# Patient Record
Sex: Male | Born: 1995 | Race: White | Hispanic: No | Marital: Single | Smoking: Former smoker
Health system: Southern US, Community
[De-identification: ages and names within clinical notes are randomized; demographics above are authoritative.]

## PROBLEM LIST (undated history)

## (undated) ENCOUNTER — Emergency Department: Admission: EM | Payer: Self-pay | Source: Home / Self Care

## (undated) DIAGNOSIS — F4310 Post-traumatic stress disorder, unspecified: Secondary | ICD-10-CM

## (undated) DIAGNOSIS — S060XAA Concussion with loss of consciousness status unknown, initial encounter: Secondary | ICD-10-CM

## (undated) DIAGNOSIS — F32A Depression, unspecified: Secondary | ICD-10-CM

## (undated) DIAGNOSIS — F329 Major depressive disorder, single episode, unspecified: Secondary | ICD-10-CM

## (undated) DIAGNOSIS — S060X9A Concussion with loss of consciousness of unspecified duration, initial encounter: Secondary | ICD-10-CM

## (undated) DIAGNOSIS — F419 Anxiety disorder, unspecified: Secondary | ICD-10-CM

---

## 2016-06-15 ENCOUNTER — Encounter: Payer: Self-pay | Admitting: Emergency Medicine

## 2016-06-15 ENCOUNTER — Emergency Department: Payer: Self-pay

## 2016-06-15 ENCOUNTER — Emergency Department
Admission: EM | Admit: 2016-06-15 | Discharge: 2016-06-15 | Disposition: A | Discharge status: Home / Self Care | Payer: Self-pay | Attending: Emergency Medicine | Admitting: Emergency Medicine

## 2016-06-15 DIAGNOSIS — F17200 Nicotine dependence, unspecified, uncomplicated: Secondary | ICD-10-CM | POA: Insufficient documentation

## 2016-06-15 DIAGNOSIS — K5900 Constipation, unspecified: Secondary | ICD-10-CM | POA: Insufficient documentation

## 2016-06-15 DIAGNOSIS — R1084 Generalized abdominal pain: Secondary | ICD-10-CM | POA: Insufficient documentation

## 2016-06-15 LAB — COMPREHENSIVE METABOLIC PANEL
ALK PHOS: 87 U/L (ref 38–126)
ALT: 62 U/L (ref 17–63)
AST: 39 U/L (ref 15–41)
Albumin: 4 g/dL (ref 3.5–5.0)
Anion gap: 4 — ABNORMAL LOW (ref 5–15)
BILIRUBIN TOTAL: 1.2 mg/dL (ref 0.3–1.2)
BUN: 12 mg/dL (ref 6–20)
CALCIUM: 9.1 mg/dL (ref 8.9–10.3)
CO2: 28 mmol/L (ref 22–32)
CREATININE: 0.82 mg/dL (ref 0.61–1.24)
Chloride: 107 mmol/L (ref 101–111)
GFR calc non Af Amer: >60 mL/min (ref >60)
GLUCOSE: 105 mg/dL — AB (ref 65–99)
Potassium: 3.8 mmol/L (ref 3.5–5.1)
SODIUM: 139 mmol/L (ref 135–145)
TOTAL PROTEIN: 6.6 g/dL (ref 6.5–8.1)

## 2016-06-15 LAB — CBC
HCT: 42.7 % (ref 40.0–52.0)
Hemoglobin: 14.3 g/dL (ref 13.0–18.0)
MCH: 29.5 pg (ref 26.0–34.0)
MCHC: 33.5 g/dL (ref 32.0–36.0)
MCV: 88.2 fL (ref 80.0–100.0)
PLATELETS: 213 10*3/uL (ref 150–440)
RBC: 4.85 MIL/uL (ref 4.40–5.90)
RDW: 13.6 % (ref 11.5–14.5)
WBC: 4 10*3/uL (ref 3.8–10.6)

## 2016-06-15 LAB — LIPASE, BLOOD: Lipase: 19 U/L (ref 11–51)

## 2016-06-15 MED ORDER — POLYETHYLENE GLYCOL 3350 17 G PO PACK: 17.0000 g | PACK | Freq: Every day | ORAL | 0 refills | Status: DC

## 2016-06-15 NOTE — ED Provider Notes (Signed)
[facility] Emergency Department Provider Note       Time seen: ----------------------------------------- 7:04 AM on 06/15/2016 -----------------------------------------     I have reviewed the triage vital signs and the nursing notes.   HISTORY   Chief Complaint Abdominal Pain    HPI Jack Holmes is a 21 y.o. male who presents to the ED for lower abdominal pain. Patient reports he was walking to work when the pain started. Patient states the pain is in his lower abdomen bilaterally and relieved with laying down. He arrives alert and oriented and in no distress. Patient reports a dull pain the has had intermittently, nothing makes it better or worse. He denies fevers, chills, vomiting or diarrhea.   History reviewed. No pertinent past medical history.  There are no active problems to display for this patient.   History reviewed. No pertinent surgical history.  Allergies Patient has no known allergies.  Social History Social History  Substance Use Topics  . Smoking status: Light Tobacco Smoker  . Smokeless tobacco: Never Used  . Alcohol use Yes    Review of Systems Constitutional: Negative for fever. Cardiovascular: Negative for chest pain. Respiratory: Negative for shortness of breath. Gastrointestinal:Positive for abdominal pain, negative for vomiting and diarrhea Genitourinary: Negative for dysuria. Musculoskeletal: Negative for back pain. Skin: Negative for rash. Neurological: Negative for headaches, focal weakness or numbness.  10-point ROS otherwise negative.  ____________________________________________   PHYSICAL EXAM:  VITAL SIGNS: ED Triage Vitals  Enc Vitals Group     BP 06/15/16 0631 (!) 149/80     Pulse Rate 06/15/16 0631 84     Resp 06/15/16 0631 18     Temp 06/15/16 0631 98.8 F (37.1 C)     Temp Source 06/15/16 0631 Oral     SpO2 06/15/16 0631 98 %     Weight 06/15/16 0633 201 lb (91.2 kg)     Height  06/15/16 0633  (1.854 m)     Head Circumference --      Peak Flow --      Pain Score 06/15/16 0631 8     Pain Loc --      Pain Edu? --      Excl. in GC? --     Constitutional: Alert and oriented. Well appearing and in no distress. Eyes: Conjunctivae are normal. PERRL. Normal extraocular movements. ENT   Head: Normocephalic and atraumatic.   Nose: No congestion/rhinnorhea.   Mouth/Throat: Mucous membranes are moist.   Neck: No stridor. Cardiovascular: Normal rate, regular rhythm. No murmurs, rubs, or gallops. Respiratory: Normal respiratory effort without tachypnea nor retractions. Breath sounds are clear and equal bilaterally. No wheezes/rales/rhonchi. Gastrointestinal: Nonspecific lower abdominal tenderness, no rebound or guarding. Normal bowel sounds. Musculoskeletal: Nontender with normal range of motion in extremities. No lower extremity tenderness nor edema. Neurologic:  Normal speech and language. No gross focal neurologic deficits are appreciated.  Skin:  Skin is warm, dry and intact. No rash noted. Psychiatric: Mood and affect are normal. Speech and behavior are normal.  ____________________________________________  ED COURSE:  Pertinent labs & imaging results that were available during my care of the patient were reviewed by me and considered in my medical decision making (see chart for details). Patient presents for abdominal pain, we will assess with labs and imaging as indicated.   Procedures ____________________________________________   LABS (pertinent positives/negatives)  Labs Reviewed  COMPREHENSIVE METABOLIC PANEL - Abnormal; Notable for the following:       Result Value  Glucose, Bld 105 (*)    Anion gap 4 (*)    All other components within normal limits  LIPASE, BLOOD  CBC  URINALYSIS, COMPLETE (UACMP) WITH MICROSCOPIC    RADIOLOGY  Abdomen 2 view IMPRESSION: Moderately increase colonic stool burden may reflect constipation  in the appropriate clinical setting. There is no evidence of small or large bowel obstruction or perforation. ____________________________________________  FINAL ASSESSMENT AND PLAN  Abdominal pain, Constipation  Plan: Patient's labs and imaging were dictated above. Patient had presented for abdominal pain which appears be constipation related. He'll be discharged with MiraLAX and is stable for outpatient follow-up.   Emily Filbert, MD   Note: This note was generated in part or whole with voice recognition software. Voice recognition is usually quite accurate but there are transcription errors that can and very often do occur. I apologize for any typographical errors that were not detected and corrected.     Emily Filbert, MD 06/15/16 0830

## 2016-06-15 NOTE — ED Triage Notes (Signed)
Pt arrived to the ED via EMS for complaints of lower abdominal pain. Pt reports that he was walking to work when the pain started. Pt states that the pain is in the lower abdomen bilaterally, relieved by laying down. Pt is AOx4 in no apparent distress.

## 2016-07-09 ENCOUNTER — Encounter: Payer: Self-pay | Admitting: Emergency Medicine

## 2016-07-09 ENCOUNTER — Emergency Department: Payer: Self-pay

## 2016-07-09 ENCOUNTER — Emergency Department
Admission: EM | Admit: 2016-07-09 | Discharge: 2016-07-09 | Disposition: A | Discharge status: Home / Self Care | Payer: Self-pay | Attending: Emergency Medicine | Admitting: Emergency Medicine

## 2016-07-09 DIAGNOSIS — S0990XA Unspecified injury of head, initial encounter: Secondary | ICD-10-CM

## 2016-07-09 DIAGNOSIS — Y999 Unspecified external cause status: Secondary | ICD-10-CM | POA: Insufficient documentation

## 2016-07-09 DIAGNOSIS — F17200 Nicotine dependence, unspecified, uncomplicated: Secondary | ICD-10-CM | POA: Insufficient documentation

## 2016-07-09 DIAGNOSIS — S060X0A Concussion without loss of consciousness, initial encounter: Secondary | ICD-10-CM | POA: Insufficient documentation

## 2016-07-09 DIAGNOSIS — Y929 Unspecified place or not applicable: Secondary | ICD-10-CM | POA: Insufficient documentation

## 2016-07-09 DIAGNOSIS — W2209XA Striking against other stationary object, initial encounter: Secondary | ICD-10-CM | POA: Insufficient documentation

## 2016-07-09 DIAGNOSIS — M542 Cervicalgia: Secondary | ICD-10-CM

## 2016-07-09 DIAGNOSIS — Z79899 Other long term (current) drug therapy: Secondary | ICD-10-CM | POA: Insufficient documentation

## 2016-07-09 DIAGNOSIS — Y9389 Activity, other specified: Secondary | ICD-10-CM | POA: Insufficient documentation

## 2016-07-09 MED ORDER — TRAMADOL HCL 50 MG PO TABS
50.0000 mg | ORAL_TABLET | Freq: Four times a day (QID) | ORAL | 0 refills | Status: DC | PRN
Start: 2016-07-09 — End: 2017-06-08

## 2016-07-09 MED ORDER — IBUPROFEN 600 MG PO TABS
600.0000 mg | ORAL_TABLET | Freq: Once | ORAL | Status: AC
  Administered 2016-07-09: 600 mg via ORAL
  Filled 2016-07-09: qty 1

## 2016-07-09 NOTE — Discharge Instructions (Signed)
Please follow up with the acute care clinic and continue to rest. Please drink lots of water and return if you have any worsening conditions or symptoms.

## 2016-07-09 NOTE — ED Notes (Signed)
Spoke with Dr. Zenda AlpersWebster regarding patient, no orders at this time.

## 2016-07-09 NOTE — ED Provider Notes (Signed)
[facility] Emergency Department Provider Note   ____________________________________________   First MD Initiated Contact with Patient 07/09/16 [phone]     (approximate)  I have reviewed the triage vital signs and the nursing notes.   HISTORY  Chief Complaint Head Injury    HPI Jack Holmes is a 21 y.o. male who comes into the hospital today with a head injury. The patient reports he bumped his head on a heavy wooden door. He reports that he was walking and tripped and hit his head on the door. He states that he hit his head hard. It is on the right frontotemporal area. The patient denies loss of consciousness but reports that he felt lightheaded and dizzy and thought he was going to pass out. The patient has had some nausea with no vomiting and he is still dizzy. He didn't take anything for pain at home. After waiting he determined the pain was too intense and he wanted to make sure that everything was okay. The patient rates his pain a 7 out of 10 in intensity. He also endorses some mild neck pain. The patient has no blurred vision. He did ice his head in the waiting room. He is here today for evaluation.   History reviewed. No pertinent past medical history.  There are no active problems to display for this patient.   History reviewed. No pertinent surgical history.  Prior to Admission medications   Medication Sig Start Date End Date Taking? Authorizing Provider  polyethylene glycol (MIRALAX / GLYCOLAX) packet Take 17 g by mouth daily. 06/15/16   Emily Filbert, MD  traMADol (ULTRAM) 50 MG tablet Take 1 tablet (50 mg total) by mouth every 6 (six) hours as needed. 07/09/16   Rebecka Apley, MD    Allergies Patient has no known allergies.  No family history on file.  Social History Social History  Substance Use Topics  . Smoking status: Light Tobacco Smoker  . Smokeless tobacco: Never Used  . Alcohol use Yes    Review of  Systems  Constitutional: No fever/chills Eyes: No visual changes. ENT: No sore throat. Cardiovascular: Denies chest pain. Respiratory: Denies shortness of breath. Gastrointestinal: Nausea with No abdominal pain, no vomiting.  No diarrhea.  No constipation. Genitourinary: Negative for dysuria. Musculoskeletal: Negative for back pain. Skin: Negative for rash. Neurological: Dizzy and lightheaded, headache   ____________________________________________   PHYSICAL EXAM:  VITAL SIGNS: ED Triage Vitals   Enc Vitals Group     BP 138/86     Pulse Rate 79     Resp (!) 22     Temp 97.9 F (36.6 C)     Temp Source Oral     SpO2 98 %     Weight      Height      Head Circumference      Peak Flow      Pain Score 8     Pain Loc      Pain Edu?      Excl. in GC?     Constitutional: Alert and oriented. Well appearing and in moderate distress. Eyes: Conjunctivae are normal. PERRL. EOMI. Head: Abrasion to right temporal frontal area with no active bleeding.. Nose: No congestion/rhinnorhea. Mouth/Throat: Mucous membranes are moist.  Oropharynx non-erythematous. Cardiovascular: Normal rate, regular rhythm. Grossly normal heart sounds.  Good peripheral circulation. Respiratory: Normal respiratory effort.  No retractions. Lungs CTAB. Gastrointestinal: Soft and nontender. No distention.  Positive bowel sounds Musculoskeletal: No lower extremity tenderness  nor edema.   Neurologic:  Normal speech and language. Cranial nerves II through XII are grossly intact with no focal motor or neuro deficits Skin:  Skin is warm, dry and intact.  Psychiatric: Mood and affect are normal.   ____________________________________________   LABS (all labs ordered are listed, but only abnormal results are displayed)  Labs Reviewed - No data to display ____________________________________________  EKG  none ____________________________________________  RADIOLOGY  CT head and cervical  spine ____________________________________________   PROCEDURES  Procedure(s) performed: None  Procedures  Critical Care performed: No  ____________________________________________   INITIAL IMPRESSION / ASSESSMENT AND PLAN / ED COURSE  Pertinent labs & imaging results that were available during my care of the patient were reviewed by me and considered in my medical decision making (see chart for details).  This is a 21 year old male who comes into the hospital today with some headache after hitting his head on a door. I did perform a CT scan of his head and cervical spine for evaluation. I will reassess the patient. He will receive a dose of ibuprofen for his headache.  Clinical Course as of Jul 10 507  Wynelle LinkSun Jul 09, 2016  0506 Negative CT HEAD.  Negative motion degraded CT CERVICAL SPINE.   CT Head Wo Contrast [AW]    Clinical Course User Index [AW] Rebecka ApleyWebster, Allison P, MD    The patient's CT scan is unremarkable he'll be discharged home to follow-up. ____________________________________________   FINAL CLINICAL IMPRESSION(S) / ED DIAGNOSES  Final diagnoses:  Neck pain  Injury of head, initial encounter  Concussion without loss of consciousness, initial encounter      NEW MEDICATIONS STARTED DURING THIS VISIT:  New Prescriptions   TRAMADOL (ULTRAM) 50 MG TABLET    Take 1 tablet (50 mg total) by mouth every 6 (six) hours as needed.     Note:  This document was prepared using Dragon voice recognition software and may include unintentional dictation errors.    Rebecka ApleyWebster, Allison P, MD 07/09/16 [phone]

## 2016-07-09 NOTE — ED Triage Notes (Signed)
Patient reports he hit his head on a door, denies loss of consciousness.  Abrasion note to right scalp.

## 2016-07-11 ENCOUNTER — Encounter: Payer: Self-pay | Admitting: Physician Assistant

## 2016-07-11 ENCOUNTER — Emergency Department
Admission: EM | Admit: 2016-07-11 | Discharge: 2016-07-11 | Disposition: A | Discharge status: Home / Self Care | Attending: Emergency Medicine | Admitting: Emergency Medicine

## 2016-07-11 DIAGNOSIS — S0081XA Abrasion of other part of head, initial encounter: Secondary | ICD-10-CM | POA: Insufficient documentation

## 2016-07-11 DIAGNOSIS — F17200 Nicotine dependence, unspecified, uncomplicated: Secondary | ICD-10-CM | POA: Insufficient documentation

## 2016-07-11 DIAGNOSIS — Y939 Activity, unspecified: Secondary | ICD-10-CM | POA: Insufficient documentation

## 2016-07-11 DIAGNOSIS — Y929 Unspecified place or not applicable: Secondary | ICD-10-CM | POA: Insufficient documentation

## 2016-07-11 DIAGNOSIS — Y999 Unspecified external cause status: Secondary | ICD-10-CM | POA: Insufficient documentation

## 2016-07-11 DIAGNOSIS — S0990XA Unspecified injury of head, initial encounter: Secondary | ICD-10-CM | POA: Diagnosis present

## 2016-07-11 DIAGNOSIS — W01198A Fall on same level from slipping, tripping and stumbling with subsequent striking against other object, initial encounter: Secondary | ICD-10-CM | POA: Diagnosis not present

## 2016-07-11 DIAGNOSIS — S069X9A Unspecified intracranial injury with loss of consciousness of unspecified duration, initial encounter: Secondary | ICD-10-CM

## 2016-07-11 MED ORDER — METOCLOPRAMIDE HCL 5 MG PO TABS: 5.0000 mg | ORAL_TABLET | Freq: Three times a day (TID) | ORAL | 0 refills | Status: DC | PRN

## 2016-07-11 MED ORDER — METOCLOPRAMIDE HCL 10 MG PO TABS
10.0000 mg | ORAL_TABLET | Freq: Once | ORAL | Status: AC
  Administered 2016-07-11: 10 mg via ORAL
  Filled 2016-07-11: qty 1

## 2016-07-11 NOTE — ED Provider Notes (Signed)
[facility] Emergency Department Provider Note ____________________________________________  Time seen: 1556  I have reviewed the triage vital signs and the nursing notes.  HISTORY  Chief Complaint  Head Injury  HPI Jack Holmes is a 21 y.o. male returns to the ED for evaluation of another minor head injury with self-reported LOC. He was initially seen here about 3 days prior for a minor head contusion after he tripped while walking through a doorway, and hit his right-sided temporo-forehead on the door. He apparently sustained a small superficial abrasion but was evaluated heredue to dizziness and lightheadedness. He denies any LOC, vomiting, or weakness. He was actually evaluated by CT scan and found to have normal head & c-spine scans. He was discharged with a prescription for Ultram. He did not fill the prescription and has been dosing Aleve in the interim. He returned to work a day later. He presents today, accompanied by his fiancee, for evaluation of continued concussive symptoms after he tripped on a wet floor last night. He reports walking through the kitchen barefoot, and slipping and hitting the right temporo-forehead on the broad side of the refrigerator. He reports he was unconscious for about 5 minutes, because he looked at the clock after the fall. He states he knows he passed out, "because I have passed out before, and I know what it feels like." He then walked to the couch for his cell phone to call his fiancee. He presents now, at the advice of his girlfriend, because she is concerned for serious injury, since he hit the same side of his head. He has had complaints of "pressure" to the right side of the head, confusion, nausea, an dizziness. He denies any vision change, vomiting, or distal  paresthesias.   History reviewed. No pertinent past medical history.  There are no active problems to display for this patient.  History reviewed. No pertinent  surgical history.  Prior to Admission medications   Medication Sig Start Date End Date Taking? Authorizing Provider  metoCLOPramide (REGLAN) 5 MG tablet Take 1 tablet (5 mg total) by mouth every 8 (eight) hours as needed for nausea or vomiting. 07/11/16   Delainee Tramel, Charlesetta Ivory, PA-C  polyethylene glycol (MIRALAX / GLYCOLAX) packet Take 17 g by mouth daily. 06/15/16   Emily Filbert, MD  traMADol (ULTRAM) 50 MG tablet Take 1 tablet (50 mg total) by mouth every 6 (six) hours as needed. 07/09/16   Rebecka Apley, MD    Allergies Patient has no known allergies.  No family history on file.  Social History Social History  Substance Use Topics  . Smoking status: Light Tobacco Smoker  . Smokeless tobacco: Never Used  . Alcohol use Yes    Review of Systems  Constitutional: Negative for fever. Eyes: Negative for visual changes. ENT: Negative for sore throat. Cardiovascular: Negative for chest pain. Respiratory: Negative for shortness of breath. Gastrointestinal: Negative for abdominal pain, vomiting and diarrhea. Report nausea Skin: Negative for rash. Neurological: Negative for focal weakness or numbness. Reports confusion and dizziness.  ____________________________________________  PHYSICAL EXAM:  VITAL SIGNS: ED Triage Vitals  Enc Vitals Group     BP 07/11/16 1527 (!) 154/82     Pulse Rate 07/11/16 1527 83     Resp 07/11/16 1527 16     Temp 07/11/16 1527 98.4 F (36.9 C)     Temp Source 07/11/16 1527 Oral     SpO2 07/11/16 1527 98 %     Weight 07/11/16 1528 201  lb (91.2 kg)     Height 07/11/16 1528 6\' 1"  (1.854 m)     Head Circumference --      Peak Flow --      Pain Score 07/11/16 1527 3     Pain Loc --      Pain Edu? --      Excl. in GC? --     Constitutional: Alert and oriented. Well appearing and in no distress. Head: Normocephalic and atraumatic. No edema, abrasion, erythema, hematoma, or laceration. Small, scabbed 0.3 cm abrasion from previous injury.   Eyes: Conjunctivae are normal. PERRL. Normal extraocular movements and fundi bilaterally.  Ears: Canals clear. TMs intact bilaterally. Nose: No congestion/rhinorrhea/epistaxis. Mouth/Throat: Mucous membranes are moist. Neck: Supple. No thyromegaly. Hematological/Lymphatic/Immunological: No cervical lymphadenopathy. Cardiovascular: Normal rate, regular rhythm. Normal distal pulses. Respiratory: Normal respiratory effort. No wheezes/rales/rhonchi. Gastrointestinal: Soft and nontender. No distention. Musculoskeletal: Nontender with normal range of motion in all extremities.  Neurologic: CN II-XII grossly intact. Normal finger-to-nose. Negative Romberg. No pronator drift. Normal rapid-alternating movements.  Normal gait without ataxia. Normal tandem walk. Normal UE/LE DTRs bilaterally. Normal speech and language. No gross focal neurologic deficits are appreciated. Skin:  Skin is warm, dry and intact. No rash noted. Psychiatric: Mood and affect are normal. Patient exhibits appropriate insight and judgment. ____________________________________________  INITIAL IMPRESSION / ASSESSMENT AND PLAN / ED COURSE  Patient with a minor head contusion with a self-reported LOC, after a ground-level fall. His exam is benign at this time. He is reassured by his exam findings which are not suggestive a serious brain injury. Give his previous minor head injury following another low-impact mechanism, he likely has an exacerbation of his concussion symptoms. He has a low likelihood of serious, organic brain injury on this presentation. He will be discharged with a prescription for Reglan and head injury precautions. He will return to work in 2-days as scheduled. [facility] or return as needed.  ____________________________________________  FINAL CLINICAL IMPRESSION(S) / ED DIAGNOSES  Final diagnoses:  Minor head injury, initial encounter  Minor head injury with loss of consciousness, initial encounter  [facility](HCC)      Karmen StabsMenshew, Charlesetta IvoryJenise V Bacon, PA-C 07/11/16 1806    Sharman CheekStafford, Phillip, MD 07/15/16 1507

## 2016-07-11 NOTE — ED Triage Notes (Signed)
Pt reports he was seen in ED Saturday and dx with a concussion. Pt went home and reports last night he slipped and hit head on the refrigerator and was unconscious for 5 minutes. Pt reports having confusion, nausea, and dizziness since fall. No blurred vision. No changes in speech or neuro deficits noted.

## 2016-07-11 NOTE — ED Notes (Signed)
See triage note, pt alert and oriented at this time. Family with pt.  Pt reports hitting head again last night,  Appears in no acute distress.

## 2016-07-11 NOTE — Discharge Instructions (Signed)
Your exam and symptoms are consistent with a minor head injury related to a ground-level fall. You have exacerbated your previous concussive symptoms. Your exam is essentially normal, without any indication of neurological deficits or serious brain injury. Continue to monitor symptoms and take the prescription nausea medicine along with OTC Tylenol and/or Motrin. [facility] or return to the ED for any worsening symptoms as discussed.

## 2016-10-16 ENCOUNTER — Emergency Department

## 2016-10-16 ENCOUNTER — Emergency Department
Admission: EM | Admit: 2016-10-16 | Discharge: 2016-10-16 | Disposition: A | Discharge status: Home / Self Care | Attending: Emergency Medicine | Admitting: Emergency Medicine

## 2016-10-16 DIAGNOSIS — F17200 Nicotine dependence, unspecified, uncomplicated: Secondary | ICD-10-CM | POA: Diagnosis not present

## 2016-10-16 DIAGNOSIS — R079 Chest pain, unspecified: Secondary | ICD-10-CM | POA: Insufficient documentation

## 2016-10-16 DIAGNOSIS — Z79899 Other long term (current) drug therapy: Secondary | ICD-10-CM | POA: Insufficient documentation

## 2016-10-16 DIAGNOSIS — R0789 Other chest pain: Secondary | ICD-10-CM

## 2016-10-16 LAB — CBC WITH DIFFERENTIAL/PLATELET
Basophils Absolute: 0 10*3/uL (ref 0–0.1)
Basophils Relative: 0 %
Eosinophils Absolute: 0.1 10*3/uL (ref 0–0.7)
Eosinophils Relative: 1 %
HEMATOCRIT: 42.6 % (ref 40.0–52.0)
HEMOGLOBIN: 15 g/dL (ref 13.0–18.0)
LYMPHS ABS: 1.9 10*3/uL (ref 1.0–3.6)
LYMPHS PCT: 33 %
MCH: 30.3 pg (ref 26.0–34.0)
MCHC: 35.1 g/dL (ref 32.0–36.0)
MCV: 86.2 fL (ref 80.0–100.0)
Monocytes Absolute: 0.5 10*3/uL (ref 0.2–1.0)
Monocytes Relative: 9 %
NEUTROS ABS: 3.2 10*3/uL (ref 1.4–6.5)
NEUTROS PCT: 57 %
Platelets: 197 10*3/uL (ref 150–440)
RBC: 4.94 MIL/uL (ref 4.40–5.90)
RDW: 12.8 % (ref 11.5–14.5)
WBC: 5.7 10*3/uL (ref 3.8–10.6)

## 2016-10-16 LAB — TROPONIN I: Troponin I: <0.03 ng/mL (ref <0.03)

## 2016-10-16 LAB — BASIC METABOLIC PANEL
ANION GAP: 7 (ref 5–15)
BUN: 11 mg/dL (ref 6–20)
CALCIUM: 9.2 mg/dL (ref 8.9–10.3)
CO2: 24 mmol/L (ref 22–32)
Chloride: 110 mmol/L (ref 101–111)
Creatinine, Ser: 0.93 mg/dL (ref 0.61–1.24)
GFR calc Af Amer: >60 mL/min (ref >60)
GLUCOSE: 101 mg/dL — AB (ref 65–99)
POTASSIUM: 3.3 mmol/L — AB (ref 3.5–5.1)
SODIUM: 141 mmol/L (ref 135–145)

## 2016-10-16 LAB — URINE DRUG SCREEN, QUALITATIVE (ARMC ONLY)
AMPHETAMINES, UR SCREEN: NOT DETECTED
BARBITURATES, UR SCREEN: NOT DETECTED
BENZODIAZEPINE, UR SCRN: NOT DETECTED
Cannabinoid 50 Ng, Ur ~~LOC~~: NOT DETECTED
Cocaine Metabolite,Ur ~~LOC~~: NOT DETECTED
MDMA (Ecstasy)Ur Screen: NOT DETECTED
METHADONE SCREEN, URINE: NOT DETECTED
OPIATE, UR SCREEN: POSITIVE — AB
Phencyclidine (PCP) Ur S: NOT DETECTED
Tricyclic, Ur Screen: NOT DETECTED

## 2016-10-16 LAB — URINALYSIS, COMPLETE (UACMP) WITH MICROSCOPIC
BACTERIA UA: NONE SEEN
BILIRUBIN URINE: NEGATIVE
Glucose, UA: NEGATIVE mg/dL
Hgb urine dipstick: NEGATIVE
Ketones, ur: NEGATIVE mg/dL
LEUKOCYTES UA: NEGATIVE
Nitrite: NEGATIVE
PROTEIN: NEGATIVE mg/dL
SQUAMOUS EPITHELIAL / LPF: NONE SEEN
Specific Gravity, Urine: 1.01 (ref 1.005–1.030)
pH: 6 (ref 5.0–8.0)

## 2016-10-16 LAB — PROTIME-INR
INR: 1.04
Prothrombin Time: 13.6 seconds (ref 11.4–15.2)

## 2016-10-16 LAB — HEPATIC FUNCTION PANEL
ALBUMIN: 3.7 g/dL (ref 3.5–5.0)
ALT: 19 U/L (ref 17–63)
AST: 23 U/L (ref 15–41)
Alkaline Phosphatase: 85 U/L (ref 38–126)
BILIRUBIN DIRECT: 0.1 mg/dL (ref 0.1–0.5)
BILIRUBIN TOTAL: 0.7 mg/dL (ref 0.3–1.2)
Indirect Bilirubin: 0.6 mg/dL (ref 0.3–0.9)
Total Protein: 6.2 g/dL — ABNORMAL LOW (ref 6.5–8.1)

## 2016-10-16 LAB — ETHANOL

## 2016-10-16 MED ORDER — LORAZEPAM 2 MG/ML IJ SOLN
2.0000 mg | Freq: Once | INTRAMUSCULAR | Status: AC
  Administered 2016-10-16: 2 mg via INTRAVENOUS
  Filled 2016-10-16: qty 1

## 2016-10-16 MED ORDER — KETOROLAC TROMETHAMINE 30 MG/ML IJ SOLN
15.0000 mg | Freq: Once | INTRAMUSCULAR | Status: AC
  Administered 2016-10-16: 15 mg via INTRAVENOUS
  Filled 2016-10-16: qty 1

## 2016-10-16 MED ORDER — SODIUM CHLORIDE 0.9 % IV BOLUS (SEPSIS)
1000.0000 mL | Freq: Once | INTRAVENOUS | Status: AC
  Administered 2016-10-16: 1000 mL via INTRAVENOUS

## 2016-10-16 MED ORDER — ONDANSETRON HCL 4 MG/2ML IJ SOLN
4.0000 mg | Freq: Once | INTRAMUSCULAR | Status: AC
  Administered 2016-10-16: 4 mg via INTRAVENOUS
  Filled 2016-10-16: qty 2

## 2016-10-16 MED ORDER — MORPHINE SULFATE (PF) 4 MG/ML IV SOLN
4.0000 mg | Freq: Once | INTRAVENOUS | Status: AC
  Administered 2016-10-16: 4 mg via INTRAVENOUS
  Filled 2016-10-16: qty 1

## 2016-10-16 NOTE — ED Triage Notes (Signed)
Per EMS pt has had a cough for a few days.  Pt states coughing up blood this morning.  Pt was experiencing extreme chest pain at work and called EMS.  Pt is A&Ox4 with a non productive cough.

## 2016-10-16 NOTE — Discharge Instructions (Signed)
Fortunately today your bloodwork your EKG and your chest x-ray were all reassuring. Please establish care with a primary care physician within the next week for reevaluation and return to the emergency department sooner for any concerns whatsoever.  It was a pleasure to take care of you today, and thank you for coming to our emergency department.  If you have any questions or concerns before leaving please ask the nurse to grab me and I'm more than happy to go through your aftercare instructions again.  If you were prescribed any opioid pain medication today such as Norco, Vicodin, Percocet, morphine, hydrocodone, or oxycodone please make sure you do not drive when you are taking this medication as it can alter your ability to drive safely.  If you have any concerns once you are home that you are not improving or are in fact getting worse before you can make it to your follow-up appointment, please do not hesitate to call 911 and come back for further evaluation.  Jack BrittleNeil Teagan Heidrick, MD  Results for orders placed or performed during the hospital encounter of 10/16/16  CBC with Differential/Platelet  Result Value Ref Range   WBC 5.7 3.8 - 10.6 K/uL   RBC 4.94 4.40 - 5.90 MIL/uL   Hemoglobin 15.0 13.0 - 18.0 g/dL   HCT 40.942.6 81.140.0 - 91.452.0 %   MCV 86.2 80.0 - 100.0 fL   MCH 30.3 26.0 - 34.0 pg   MCHC 35.1 32.0 - 36.0 g/dL   RDW 78.212.8 95.611.5 - 21.314.5 %   Platelets 197 150 - 440 K/uL   Neutrophils Relative % 57 %   Neutro Abs 3.2 1.4 - 6.5 K/uL   Lymphocytes Relative 33 %   Lymphs Abs 1.9 1.0 - 3.6 K/uL   Monocytes Relative 9 %   Monocytes Absolute 0.5 0.2 - 1.0 K/uL   Eosinophils Relative 1 %   Eosinophils Absolute 0.1 0 - 0.7 K/uL   Basophils Relative 0 %   Basophils Absolute 0.0 0 - 0.1 K/uL  Protime-INR  Result Value Ref Range   Prothrombin Time 13.6 11.4 - 15.2 seconds   INR 1.04   Troponin I  Result Value Ref Range   Troponin I <0.03 <0.03 ng/mL  Urinalysis, Complete w Microscopic    Result Value Ref Range   Color, Urine YELLOW (A) YELLOW   APPearance CLEAR (A) CLEAR   Specific Gravity, Urine 1.010 1.005 - 1.030   pH 6.0 5.0 - 8.0   Glucose, UA NEGATIVE NEGATIVE mg/dL   Hgb urine dipstick NEGATIVE NEGATIVE   Bilirubin Urine NEGATIVE NEGATIVE   Ketones, ur NEGATIVE NEGATIVE mg/dL   Protein, ur NEGATIVE NEGATIVE mg/dL   Nitrite NEGATIVE NEGATIVE   Leukocytes, UA NEGATIVE NEGATIVE   RBC / HPF 0-5 0 - 5 RBC/hpf   WBC, UA 0-5 0 - 5 WBC/hpf   Bacteria, UA NONE SEEN NONE SEEN   Squamous Epithelial / LPF NONE SEEN NONE SEEN   Mucous PRESENT   Urine Drug Screen, Qualitative  Result Value Ref Range   Tricyclic, Ur Screen NONE DETECTED NONE DETECTED   Amphetamines, Ur Screen NONE DETECTED NONE DETECTED   MDMA (Ecstasy)Ur Screen NONE DETECTED NONE DETECTED   Cocaine Metabolite,Ur [place] NONE DETECTED NONE DETECTED   Opiate, Ur Screen POSITIVE (A) NONE DETECTED   Phencyclidine (PCP) Ur S NONE DETECTED NONE DETECTED   Cannabinoid 50 Ng, Ur [place] NONE DETECTED NONE DETECTED   Barbiturates, Ur Screen NONE DETECTED NONE DETECTED   Benzodiazepine, Ur Scrn NONE  DETECTED NONE DETECTED   Methadone Scn, Ur NONE DETECTED NONE DETECTED  Basic metabolic panel  Result Value Ref Range   Sodium 141 135 - 145 mmol/L   Potassium 3.3 (L) 3.5 - 5.1 mmol/L   Chloride 110 101 - 111 mmol/L   CO2 24 22 - 32 mmol/L   Glucose, Bld 101 (H) 65 - 99 mg/dL   BUN 11 6 - 20 mg/dL   Creatinine, Ser 1.61 0.61 - 1.24 mg/dL   Calcium 9.2 8.9 - 09.6 mg/dL   GFR calc non Af Amer >60 >60 mL/min   GFR calc Af Amer >60 >60 mL/min   Anion gap 7 5 - 15  Ethanol  Result Value Ref Range   Alcohol, Ethyl (B) <5 <5 mg/dL  Hepatic function panel  Result Value Ref Range   Total Protein 6.2 (L) 6.5 - 8.1 g/dL   Albumin 3.7 3.5 - 5.0 g/dL   AST 23 15 - 41 U/L   ALT 19 17 - 63 U/L   Alkaline Phosphatase 85 38 - 126 U/L   Total Bilirubin 0.7 0.3 - 1.2 mg/dL   Bilirubin, Direct 0.1 0.1 - 0.5 mg/dL   Indirect  Bilirubin 0.6 0.3 - 0.9 mg/dL   Dg Chest Port 1 View  Result Date: 10/16/2016 CLINICAL DATA:  Cough, hemoptysis and chest pain. EXAM: PORTABLE CHEST 1 VIEW COMPARISON:  None. FINDINGS: The heart size and mediastinal contours are within normal limits. There is no evidence of pulmonary edema, consolidation, pneumothorax, nodule or pleural fluid. The visualized skeletal structures are unremarkable. IMPRESSION: No active disease. Electronically Signed   By: Irish Lack M.D.   On: 10/16/2016 20:28

## 2016-10-16 NOTE — ED Provider Notes (Signed)
[facility] Emergency Department Provider Note  ____________________________________________   First MD Initiated Contact with Patient 10/16/16 2007     (approximate)  I have reviewed the triage vital signs and the nursing notes.   HISTORY  Chief Complaint Chest Pain    HPI Jack Holmes is a 21 y.o. male history of presents to the emergency Department with roughly 3 days of progressive sharp severe pleuritic chest pain and shortness of breath. Associated with an overwhelming sense of doom. He denies cocaine or methamphetamine use. He denies history of familial hyperlipidemia. He denies smoking. He denies history of deep pain thrombus or pulmonary embolism. He denies antecedent viral illness. Be improved when he calms down and seems to be worse when hyperventilating. It is not positional.   History reviewed. No pertinent past medical history.  There are no active problems to display for this patient.   History reviewed. No pertinent surgical history.  Prior to Admission medications   Medication Sig Start Date End Date Taking? Authorizing Provider  metoCLOPramide (REGLAN) 5 MG tablet Take 1 tablet (5 mg total) by mouth every 8 (eight) hours as needed for nausea or vomiting. 07/11/16   Menshew, Charlesetta IvoryJenise V Bacon, PA-C  polyethylene glycol (MIRALAX / GLYCOLAX) packet Take 17 g by mouth daily. 06/15/16   Emily FilbertWilliams, Jonathan E, MD  traMADol (ULTRAM) 50 MG tablet Take 1 tablet (50 mg total) by mouth every 6 (six) hours as needed. 07/09/16   Rebecka ApleyWebster, Allison P, MD    Allergies Patient has no known allergies.  History reviewed. No pertinent family history.  Social History Social History  Substance Use Topics  . Smoking status: Light Tobacco Smoker  . Smokeless tobacco: Never Used  . Alcohol use Yes    Review of Systems Constitutional: No fever/chills Eyes: No visual changes. ENT: No sore throat. Cardiovascular: Positive chest pain. Respiratory: Positive  shortness of breath. Gastrointestinal: No abdominal pain.  No nausea, no vomiting.  No diarrhea.  No constipation. Genitourinary: Negative for dysuria. Musculoskeletal: Negative for back pain. Skin: Negative for rash. Neurological: Negative for headaches, focal weakness or numbness.   ____________________________________________   PHYSICAL EXAM:  VITAL SIGNS: ED Triage Vitals  Enc Vitals Group     BP      Pulse      Resp      Temp      Temp src      SpO2      Weight      Height      Head Circumference      Peak Flow      Pain Score      Pain Loc      Pain Edu?      Excl. in GC?     Constitutional: Anxious appearing hyperventilating and moaning in pain Eyes: PERRL EOMI. Head: Atraumatic. Nose: No congestion/rhinnorhea. Mouth/Throat: No trismus Neck: No stridor.   Cardiovascular: Normal rate, regular rhythm. Grossly normal heart sounds.  Good peripheral circulation. Respiratory: Hyperventilating taking shallow rapid breaths Gastrointestinal: Soft nontender Musculoskeletal: No lower extremity edema   Neurologic:  Normal speech and language. No gross focal neurologic deficits are appreciated. Skin:  Skin is warm, dry and intact. No rash noted. Psychiatric: Mood and affect are normal. Speech and behavior are normal.    ____________________________________________   DIFFERENTIAL includes but not limited to  Pneumothorax, pulmonary embolus, acute coronary syndrome, pneumonia, panic attack ____________________________________________   LABS (all labs ordered are listed, but only abnormal results are displayed)  Labs Reviewed  URINALYSIS, COMPLETE (UACMP) WITH MICROSCOPIC - Abnormal; Notable for the following:       Result Value   Color, Urine YELLOW (*)    APPearance CLEAR (*)    All other components within normal limits  URINE DRUG SCREEN, QUALITATIVE ([facility] ONLY) - Abnormal; Notable for the following:    Opiate, Ur Screen POSITIVE (*)    All other components  within normal limits  BASIC METABOLIC PANEL - Abnormal; Notable for the following:    Potassium 3.3 (*)    Glucose, Bld 101 (*)    All other components within normal limits  HEPATIC FUNCTION PANEL - Abnormal; Notable for the following:    Total Protein 6.2 (*)    All other components within normal limits  CBC WITH DIFFERENTIAL/PLATELET  PROTIME-INR  TROPONIN I  ETHANOL  BRAIN NATRIURETIC PEPTIDE    No signs of acute ischemia __________________________________________  EKG  ED ECG REPORT I, Merrily Brittle, the attending physician, personally viewed and interpreted this ECG.  Date: 10/16/2016 Rate: 91 Rhythm: normal sinus rhythm QRS Axis: normal Intervals: normal ST/T Wave abnormalities: Mild concave up lateral trace ST elevation with notching in V6 Narrative Interpretation: unremarkable most likely benign early repolarization and not pericarditis  ____________________________________________  RADIOLOGY  Chest x-ray with no acute disease ____________________________________________   PROCEDURES  Procedure(s) performed: no  Procedures  Critical Care performed: no  Observation: no ____________________________________________   INITIAL IMPRESSION / ASSESSMENT AND PLAN / ED COURSE  Pertinent labs & imaging results that were available during my care of the patient were reviewed by me and considered in my medical decision making (see chart for details).  On arrival the patient is anxious appearing and hyperventilating. He did have breath sounds bilaterally but I was most concerned about a pneumothorax. Fortunately chest x-ray is negative. He was initially given morphine and Toradol for his pain however did not work very well. As he then appeared to be hyperventilating and anxious I gave him 2 mg of Ativan and shortly thereafter he was calm and cooperative intact sting. I had a lengthy discussion with the patient and his fiance at bedside regarding his symptoms and  we'll refer him to establish primary care. He is discharged home in improved condition.      ____________________________________________   FINAL CLINICAL IMPRESSION(S) / ED DIAGNOSES  Final diagnoses:  Atypical chest pain      NEW MEDICATIONS STARTED DURING THIS VISIT:  New Prescriptions   No medications on file     Note:  This document was prepared using Dragon voice recognition software and may include unintentional dictation errors.     Merrily Brittle, MD 10/16/16 2220

## 2017-06-04 ENCOUNTER — Emergency Department
Admission: EM | Admit: 2017-06-04 | Discharge: 2017-06-04 | Disposition: A | Discharge status: Other Acute Inpatient Hospital | Payer: Self-pay | Attending: Emergency Medicine | Admitting: Emergency Medicine

## 2017-06-04 ENCOUNTER — Inpatient Hospital Stay
Admission: AD | Admit: 2017-06-04 | Discharge: 2017-06-08 | DRG: 885 | Disposition: A | Discharge status: Home / Self Care | Payer: No Typology Code available for payment source | Attending: Psychiatry | Admitting: Psychiatry

## 2017-06-04 ENCOUNTER — Other Ambulatory Visit: Payer: Self-pay

## 2017-06-04 DIAGNOSIS — F17200 Nicotine dependence, unspecified, uncomplicated: Secondary | ICD-10-CM | POA: Diagnosis present

## 2017-06-04 DIAGNOSIS — F5104 Psychophysiologic insomnia: Secondary | ICD-10-CM | POA: Diagnosis present

## 2017-06-04 DIAGNOSIS — Z720 Tobacco use: Secondary | ICD-10-CM | POA: Insufficient documentation

## 2017-06-04 DIAGNOSIS — F1010 Alcohol abuse, uncomplicated: Secondary | ICD-10-CM

## 2017-06-04 DIAGNOSIS — F4310 Post-traumatic stress disorder, unspecified: Secondary | ICD-10-CM | POA: Diagnosis present

## 2017-06-04 DIAGNOSIS — R4587 Impulsiveness: Secondary | ICD-10-CM | POA: Diagnosis present

## 2017-06-04 DIAGNOSIS — F329 Major depressive disorder, single episode, unspecified: Secondary | ICD-10-CM | POA: Diagnosis not present

## 2017-06-04 DIAGNOSIS — Z915 Personal history of self-harm: Secondary | ICD-10-CM | POA: Diagnosis not present

## 2017-06-04 DIAGNOSIS — F322 Major depressive disorder, single episode, severe without psychotic features: Principal | ICD-10-CM | POA: Diagnosis present

## 2017-06-04 DIAGNOSIS — F909 Attention-deficit hyperactivity disorder, unspecified type: Secondary | ICD-10-CM | POA: Diagnosis present

## 2017-06-04 DIAGNOSIS — R45851 Suicidal ideations: Secondary | ICD-10-CM | POA: Diagnosis present

## 2017-06-04 DIAGNOSIS — Z23 Encounter for immunization: Secondary | ICD-10-CM

## 2017-06-04 DIAGNOSIS — F332 Major depressive disorder, recurrent severe without psychotic features: Secondary | ICD-10-CM | POA: Diagnosis present

## 2017-06-04 DIAGNOSIS — F1290 Cannabis use, unspecified, uncomplicated: Secondary | ICD-10-CM | POA: Diagnosis present

## 2017-06-04 LAB — COMPREHENSIVE METABOLIC PANEL
ALBUMIN: 4.1 g/dL (ref 3.5–5.0)
ALT: 22 U/L (ref 17–63)
ANION GAP: 6 (ref 5–15)
AST: 24 U/L (ref 15–41)
Alkaline Phosphatase: 92 U/L (ref 38–126)
BUN: 11 mg/dL (ref 6–20)
CO2: 26 mmol/L (ref 22–32)
Calcium: 9.2 mg/dL (ref 8.9–10.3)
Chloride: 109 mmol/L (ref 101–111)
Creatinine, Ser: 0.85 mg/dL (ref 0.61–1.24)
GFR calc Af Amer: >60 mL/min (ref >60)
GFR calc non Af Amer: >60 mL/min (ref >60)
GLUCOSE: 101 mg/dL — AB (ref 65–99)
Potassium: 4 mmol/L (ref 3.5–5.1)
SODIUM: 141 mmol/L (ref 135–145)
Total Bilirubin: 0.9 mg/dL (ref 0.3–1.2)
Total Protein: 6.9 g/dL (ref 6.5–8.1)

## 2017-06-04 LAB — CBC
HEMATOCRIT: 47.2 % (ref 40.0–52.0)
HEMOGLOBIN: 16.1 g/dL (ref 13.0–18.0)
MCH: 29.7 pg (ref 26.0–34.0)
MCHC: 34 g/dL (ref 32.0–36.0)
MCV: 87.3 fL (ref 80.0–100.0)
Platelets: 203 10*3/uL (ref 150–440)
RBC: 5.41 MIL/uL (ref 4.40–5.90)
RDW: 13.3 % (ref 11.5–14.5)
WBC: 5 10*3/uL (ref 3.8–10.6)

## 2017-06-04 LAB — ETHANOL: Alcohol, Ethyl (B): <10 mg/dL (ref <10)

## 2017-06-04 LAB — ACETAMINOPHEN LEVEL

## 2017-06-04 LAB — SALICYLATE LEVEL

## 2017-06-04 MED ORDER — LORAZEPAM 2 MG PO TABS
2.0000 mg | ORAL_TABLET | Freq: Once | ORAL | Status: AC
  Administered 2017-06-04: 2 mg via ORAL
  Filled 2017-06-04: qty 1

## 2017-06-04 MED ORDER — QUETIAPINE FUMARATE 25 MG PO TABS: 100.0000 mg | ORAL_TABLET | Freq: Every day | ORAL | Status: DC

## 2017-06-04 MED ORDER — TRAZODONE HCL 100 MG PO TABS
100.0000 mg | ORAL_TABLET | Freq: Every evening | ORAL | Status: DC | PRN
  Administered 2017-06-04 – 2017-06-05 (×2): 100 mg via ORAL
  Filled 2017-06-04 (×2): qty 1

## 2017-06-04 MED ORDER — ALUM & MAG HYDROXIDE-SIMETH 200-200-20 MG/5ML PO SUSP: 30.0000 mL | ORAL | Status: DC | PRN

## 2017-06-04 MED ORDER — QUETIAPINE FUMARATE 100 MG PO TABS
100.0000 mg | ORAL_TABLET | Freq: Every day | ORAL | Status: DC
  Administered 2017-06-04 – 2017-06-05 (×2): 100 mg via ORAL
  Filled 2017-06-04 (×2): qty 1

## 2017-06-04 MED ORDER — HYDROXYZINE HCL 50 MG PO TABS: 50.0000 mg | ORAL_TABLET | ORAL | Status: DC | PRN

## 2017-06-04 MED ORDER — MAGNESIUM HYDROXIDE 400 MG/5ML PO SUSP: 30.0000 mL | Freq: Every day | ORAL | Status: DC | PRN

## 2017-06-04 MED ORDER — PNEUMOCOCCAL VAC POLYVALENT 25 MCG/0.5ML IJ INJ
0.5000 mL | INJECTION | INTRAMUSCULAR | Status: AC
  Administered 2017-06-06: 0.5 mL via INTRAMUSCULAR
  Filled 2017-06-04 (×2): qty 0.5

## 2017-06-04 MED ORDER — HYDROXYZINE HCL 25 MG PO TABS
50.0000 mg | ORAL_TABLET | ORAL | Status: DC | PRN
  Filled 2017-06-04: qty 2

## 2017-06-04 MED ORDER — ACETAMINOPHEN 325 MG PO TABS
650.0000 mg | ORAL_TABLET | Freq: Four times a day (QID) | ORAL | Status: DC | PRN
  Administered 2017-06-04 – 2017-06-06 (×3): 650 mg via ORAL
  Filled 2017-06-04 (×3): qty 2

## 2017-06-04 NOTE — BH Assessment (Signed)
Assessment Note  Jack Holmes is an 22 y.o. male who presents to the ER via law enforcement due to his wife having concerns of the patient harming or ending his life. Patient reports, his friend called her and informed her about their conversation and what he had done. Patient apparently had a gun in his mouth and threatened to end his life. When asked about the gun, patient became upset and didn't give details about what happened but he admitted to having a gun today. He also admits to having a history of cutting and he's currently cutting his arm due to the "martial stress."   According to the patient, he and his wife are having marital problems. He admits he had a gun and when asked questions, he avoid answering and start minimizing the events that took place today. He also admits to alcohol use and it has increased. He also reports of been inpatient at [facility] when he was a teenager. He was admitted due to a suicide attempt. He states, since then he has had no attempts. However, he had dealt with depression for approximately three years. He also reports of having PTSD from when he served in the [employer].  During the interview, the patient was calm and pleasant. It was evident he as agitated and he voiced he desire to go home. He was fidgety and restless as he spoke. He denies the use of any other mind-altering substance except alcohol. Patient denies HI and AV/H.  Diagnosis: Depression  Past Medical History: No past medical history on file.  No past surgical history on file.  Family History: No family history on file.  Social History:  reports that he has been smoking.  He has never used smokeless tobacco. He reports that he drinks alcohol. He reports that he does not use drugs.  Additional Social History:  Alcohol / Drug Use Pain Medications: See PTA Prescriptions: See PTA Over the Counter: See PTA History of alcohol / drug use?: Yes Longest period of sobriety (when/how  long): Unable to quantify Negative Consequences of Use: Personal relationships, Financial Withdrawal Symptoms: (n/a) Substance #1 Name of Substance 1: Alcohol 1 - Age of First Use: Unknown 1 - Amount (size/oz): "Like six beers" 1 - Frequency: n/a 1 - Duration: Unable to quantify 1 - Last Use / Amount: 06/02/2017  CIWA:   COWS:    Allergies: No Known Allergies  Home Medications:  (Not in a hospital admission)  OB/GYN Status:  No LMP for male patient.  General Assessment Data Location of Assessment: [facility] ED TTS Assessment: In system Is this a Tele or Face-to-Face Assessment?: Face-to-Face Is this an Initial Assessment or a Re-assessment for this encounter?: Initial Assessment Marital status: Married Wilder name: n/a Is patient pregnant?: No Pregnancy Status: No Living Arrangements: Spouse/significant other, Non-relatives/Friends(A friend lives with them as well) Can pt return to current living arrangement?: Yes Admission Status: Involuntary Is patient capable of signing voluntary admission?: No(Under IVC) Referral Source: Self/Family/Friend Insurance type: T[employer]are  Medical Screening Exam [facility] Walk-[facility]) Medical Exam completed: Yes  Crisis Care Plan Living Arrangements: Spouse/significant other, Non-relatives/Friends(A friend lives with them as well) Legal Guardian: Other:(Self) Name of Psychiatrist: Reports of none Name of Therapist: Reports of none  Education Status Is patient currently in school?: No Is the patient employed, unemployed or receiving disability?: Employed  Risk to self with the past 6 months Suicidal Ideation: Yes-Currently Present Has patient been a risk to self within the past 6 months prior to  admission? : Yes Suicidal Intent: Yes-Currently Present Has patient had any suicidal intent within the past 6 months prior to admission? : Yes Is patient at risk for suicide?: Yes Suicidal Plan?: Yes-Currently Present Has patient had any suicidal  plan within the past 6 months prior to admission? : Yes Specify Current Suicidal Plan: Shoot self Access to Means: Yes Specify Access to Suicidal Means: Own a gun What has been your use of drugs/alcohol within the last 12 months?: Alcohol Previous Attempts/Gestures: No How many times?: 1 Other Self Harm Risks: Reports of none Triggers for Past Attempts: None known Intentional Self Injurious Behavior: Cutting Comment - Self Injurious Behavior: History of cutting Family Suicide History: Yes(Maternal Uncle) Recent stressful life event(s): Other (Comment)(Martial Problems) Persecutory voices/beliefs?: No Depression: Yes Depression Symptoms: Tearfulness, Isolating, Guilt, Feeling angry/irritable Substance abuse history and/or treatment for substance abuse?: No Suicide prevention information given to non-admitted patients: Not applicable  Risk to Others within the past 6 months Homicidal Ideation: No Does patient have any lifetime risk of violence toward others beyond the six months prior to admission? : No Thoughts of Harm to Others: No Current Homicidal Intent: No Current Homicidal Plan: No Access to Homicidal Means: No Identified Victim: Reports of none History of harm to others?: No Assessment of Violence: None Noted Violent Behavior Description: Reports of none Does patient have access to weapons?: No Criminal Charges Pending?: No Does patient have a court date: No Is patient on probation?: No  Psychosis Hallucinations: None noted Delusions: None noted  Mental Status Report Appearance/Hygiene: Unremarkable, In scrubs Eye Contact: Good Motor Activity: Freedom of movement, Unremarkable Speech: Logical/coherent, Unremarkable Level of Consciousness: Alert, Irritable Mood: Depressed, Anxious, Sad, Irritable, Pleasant Affect: Anxious, Appropriate to circumstance, Depressed, Sad Anxiety Level: Minimal Thought Processes: Coherent, Relevant Judgement: Partial Orientation:  Person, Place, Time, Situation, Appropriate for developmental age Obsessive Compulsive Thoughts/Behaviors: Minimal  Cognitive Functioning Concentration: Normal Memory: Recent Intact, Remote Intact Is patient IDD: No Is patient DD?: No Insight: Fair Impulse Control: Poor Appetite: Fair Have you had any weight changes? : No Change Sleep: Decreased Total Hours of Sleep: 6 Vegetative Symptoms: None  ADLScreening [facility]) Patient's cognitive ability adequate to safely complete daily activities?: Yes Patient able to express need for assistance with ADLs?: Yes Independently performs ADLs?: Yes (appropriate for developmental age)  Prior Inpatient Therapy Prior Inpatient Therapy: Yes Prior Therapy Dates: 2014 Prior Therapy Facilty[facility]Provider[facility] Reason for Treatment: Depression (Suicide attempt)  Prior Outpatient Therapy Prior Outpatient Therapy: No Does patient have an ACCT team?: No Does patient have Intensive In-House Services?  : No Does patient have [facility] services? : No Does patient have P4CC services?: No  ADL Screening (condition at time of admission) Patient's cognitive ability adequate to safely complete daily activities?: Yes Is the patient deaf or have difficulty hearing?: No Does the patient have difficulty seeing, even when wearing glasses/contacts?: No Does the patient have difficulty concentrating, remembering, or making decisions?: No Patient able to express need for assistance with ADLs?: Yes Does the patient have difficulty dressing or bathing?: No Independently performs ADLs?: Yes (appropriate for developmental age) Does the patient have difficulty walking or climbing stairs?: No Weakness of Legs: None Weakness of Arms/Hands: None  Home Assistive Devices/Equipment Home Assistive Devices/Equipment: None  Therapy Consults (therapy consults require a physician order) PT Evaluation Needed: No OT Evalulation Needed: No SLP  Evaluation Needed: No Abuse/Neglect Assessment (Assessment to be complete while patient is alone) Abuse/Neglect Assessment Can Be Completed: Yes Physical  Abuse: Denies Verbal Abuse: Denies Sexual Abuse: Denies Exploitation of patient/patient's resources: Denies Values / Beliefs Cultural Requests During Hospitalization: None Spiritual Requests During Hospitalization: None Consults Spiritual Care Consult Needed: No Social Work Consult Needed: No         Child/Adolescent Assessment Running Away Risk: Denies(Patient is an adult)  Disposition:  Disposition Initial Assessment Completed for this Encounter: Yes  On Site Evaluation by:   Reviewed with Physician:    Lilyan Gilford MS, LCAS, LPC, NCC, CCSI Therapeutic Triage Specialist 06/04/2017 4:36 PM

## 2017-06-04 NOTE — ED Notes (Signed)
IVC/consult ordered/[facility]

## 2017-06-04 NOTE — ED Triage Notes (Signed)
Brought in by police under IVC.  Patient is coopertive and calm now, says he attempted suicide this am by putting gun  In his mouth.  Has scratches that he did himself and some from bushes.

## 2017-06-04 NOTE — Consult Note (Signed)
[place] Psychiatry Consult   Reason for Consult: Consult for 22 year old man with a history of mood and behavior problems who comes to the hospital after threatening to kill himself Referring Physician: Corky Downs Patient Identification: Jack Holmes MRN:  [id number] Principal Diagnosis: Severe recurrent major depression without psychotic features [facility]) Diagnosis:   Patient Active Problem List   Diagnosis Date Noted  . Severe recurrent major depression without psychotic features ([place]) [F33.2] 06/04/2017  . Alcohol abuse [F10.10] 06/04/2017    Total Time spent with patient: 1 hour  Subjective:   Jack Holmes is a 22 y.o. male patient admitted with "my wife was worried about me".  HPI: Patient interviewed chart reviewed.  22 year old man brought in under IVC papers that report that the patient threatened to kill himself and put a gun in his mouth this morning.  On interview the patient was anxious and guarded.  He initially only said that his wife was worried about him and could not imagine what it was she was worried about.  He admitted however he has been going through a "rough time".  Mood has been sad anxious and overwhelmed.  Sleep is poor.  Eating as adequate.  Patient is not currently getting any outpatient psychiatric treatment.  Admits that he is drinking heavily and was drinking last night.  Denies that he is currently using other drugs.  Patient eventually admitted that he had put a gun in his mouth this morning.  Also that he had been cutting himself all over which is obvious.  He says his greatest stress is trying to get along with his wife.  Social history: Married.  Says that he works at [employer]  Medical history: Multiple lacerations.  Substance abuse history: History of alcohol abuse in particular not involved in any kind of treatment no history of seizures.  Past Psychiatric History: Patient was seen as a youth and adolescent for behavior problems.  Had a  hospitalization for suicidality when he was in the seventh grade.  Was treated for ADHD as a child also but says he has not been on medicine since high school.  Risk to Self: Is patient at risk for suicide?: Yes Risk to Others:   Prior Inpatient Therapy:   Prior Outpatient Therapy:    Past Medical History: No past medical history on file. No past surgical history on file. Family History: No family history on file. Family Psychiatric  History: Positive for depression in his mother and an uncle who killed himself Social History:  Social History   Substance and Sexual Activity  Alcohol Use Yes     Social History   Substance and Sexual Activity  Drug Use No    Social History   Socioeconomic History  . Marital status: Single    Spouse name: Not on file  . Number of children: Not on file  . Years of education: Not on file  . Highest education level: Not on file  Occupational History  . Not on file  Social Needs  . Financial resource strain: Not on file  . Food insecurity:    Worry: Not on file    Inability: Not on file  . Transportation needs:    Medical: Not on file    Non-medical: Not on file  Tobacco Use  . Smoking status: Light Tobacco Smoker  . Smokeless tobacco: Never Used  Substance and Sexual Activity  . Alcohol use: Yes  . Drug use: No  . Sexual activity: Yes    Birth  control/protection: Condom  Lifestyle  . Physical activity:    Days per week: Not on file    Minutes per session: Not on file  . Stress: Not on file  Relationships  . Social connections:    Talks on phone: Not on file    Gets together: Not on file    Attends religious service: Not on file    Active member of club or organization: Not on file    Attends meetings of clubs or organizations: Not on file    Relationship status: Not on file  Other Topics Concern  . Not on file  Social History Narrative  . Not on file   Additional Social History:    Allergies:  No Known Allergies  Labs:    Results for orders placed or performed during the hospital encounter of 06/04/17 (from the past 48 hour(s))  Comprehensive metabolic panel     Status: Abnormal   Collection Time: 06/04/17  1:40 PM  Result Value Ref Range   Sodium 141 135 - 145 mmol/L   Potassium 4.0 3.5 - 5.1 mmol/L   Chloride 109 101 - 111 mmol/L   CO2 26 22 - 32 mmol/L   Glucose, Bld 101 (H) 65 - 99 mg/dL   BUN 11 6 - 20 mg/dL   Creatinine, Ser 0.85 0.61 - 1.24 mg/dL   Calcium 9.2 8.9 - 10.3 mg/dL   Total Protein 6.9 6.5 - 8.1 g/dL   Albumin 4.1 3.5 - 5.0 g/dL   AST 24 15 - 41 U/L   ALT 22 17 - 63 U/L   Alkaline Phosphatase 92 38 - 126 U/L   Total Bilirubin 0.9 0.3 - 1.2 mg/dL   GFR calc non Af Amer >60 >60 mL/min   GFR calc Af Amer >60 >60 mL/min    Comment: (NOTE) The eGFR has been calculated using the CKD EPI equation. This calculation has not been validated in all clinical situations. eGFR's persistently <60 mL/min signify possible Chronic Kidney Disease.    Anion gap 6 5 - 15    Comment: Performed at [facility], [address] [place], [place] [address]  Ethanol     Status: None   Collection Time: 06/04/17  1:40 PM  Result Value Ref Range   Alcohol, Ethyl (B) <10 <10 mg/dL    Comment:        LOWEST DETECTABLE LIMIT FOR SERUM ALCOHOL IS 10 mg/dL FOR MEDICAL PURPOSES ONLY Performed at [facility], [address] [place], [place] [address]   Salicylate level     Status: None   Collection Time: 06/04/17  1:40 PM  Result Value Ref Range   Salicylate Lvl <0.9 2.8 - 30.0 mg/dL    Comment: Performed at [facility], [address] [place], [place] [address]  Acetaminophen level     Status: Abnormal   Collection Time: 06/04/17  1:40 PM  Result Value Ref Range   Acetaminophen (Tylenol), Serum <10 (L) 10 - 30 ug/mL    Comment:        THERAPEUTIC CONCENTRATIONS VARY SIGNIFICANTLY. A RANGE OF 10-30 ug/mL MAY BE AN EFFECTIVE CONCENTRATION FOR MANY PATIENTS. HOWEVER,  SOME ARE BEST TREATED AT CONCENTRATIONS OUTSIDE THIS RANGE. ACETAMINOPHEN CONCENTRATIONS >150 ug/mL AT 4 HOURS AFTER INGESTION AND >50 ug/mL AT 12 HOURS AFTER INGESTION ARE OFTEN ASSOCIATED WITH TOXIC REACTIONS. Performed at [facility], [address], [place], [place] [address]   cbc     Status: None   Collection  Time: 06/04/17  1:40 PM  Result Value Ref Range   WBC 5.0 3.8 - 10.6 K/uL   RBC 5.41 4.40 - 5.90 MIL/uL   Hemoglobin 16.1 13.0 - 18.0 g/dL   HCT 47.2 40.0 - 52.0 %   MCV 87.3 80.0 - 100.0 fL   MCH 29.7 26.0 - 34.0 pg   MCHC 34.0 32.0 - 36.0 g/dL   RDW 13.3 11.5 - 14.5 %   Platelets 203 150 - 440 K/uL    Comment: Performed at [facility], [address] [place], [place] [address]    No current facility-administered medications for this encounter.    Current Outpatient Medications  Medication Sig Dispense Refill  . metoCLOPramide (REGLAN) 5 MG tablet Take 1 tablet (5 mg total) by mouth every 8 (eight) hours as needed for nausea or vomiting. 15 tablet 0  . polyethylene glycol (MIRALAX / GLYCOLAX) packet Take 17 g by mouth daily. 14 each 0  . traMADol (ULTRAM) 50 MG tablet Take 1 tablet (50 mg total) by mouth every 6 (six) hours as needed. 12 tablet 0    Musculoskeletal: Strength & Muscle Tone: within normal limits Gait & Station: normal Patient leans: N/A  Psychiatric Specialty Exam: Physical Exam  Nursing note and vitals reviewed. Constitutional: He appears well-developed and well-nourished.  HENT:  Head: Normocephalic and atraumatic.  Eyes: Pupils are equal, round, and reactive to light. Conjunctivae are normal.  Neck: Normal range of motion.  Cardiovascular: Normal heart sounds.  Respiratory: Effort normal.  GI: Soft.  Musculoskeletal: Normal range of motion.  Neurological: He is alert.  Skin: Skin is warm and dry.     Psychiatric: His affect is labile. His speech is tangential. He is agitated. He is not aggressive, not  hyperactive and not combative. Thought content is not paranoid. Cognition and memory are impaired. He expresses impulsivity. He expresses suicidal ideation. He expresses no homicidal ideation. He expresses suicidal plans.    Review of Systems  Constitutional: Negative.   HENT: Negative.   Eyes: Negative.   Respiratory: Negative.   Cardiovascular: Negative.   Gastrointestinal: Negative.   Musculoskeletal: Negative.   Skin: Negative.   Neurological: Negative.   Psychiatric/Behavioral: Positive for depression, substance abuse and suicidal ideas. Negative for hallucinations and memory loss. The patient is nervous/anxious and has insomnia.     Temperature 98.3 F (36.8 C), temperature source Oral, height 6' 2"  (1.88 m), weight 95.3 kg (210 lb).Body mass index is 26.96 kg/m.  General Appearance: Casual  Eye Contact:  Minimal  Speech:  Clear and Coherent  Volume:  Increased  Mood:  Anxious, Depressed and Irritable  Affect:  Congruent  Thought Process:  Disorganized  Orientation:  Full (Time, Place, and Person)  Thought Content:  Illogical, Rumination and Tangential  Suicidal Thoughts:  Yes.  with intent/plan  Homicidal Thoughts:  No  Memory:  Immediate;   Fair Recent;   Fair Remote;   Fair  Judgement:  Poor  Insight:  Shallow  Psychomotor Activity:  Restlessness  Concentration:  Concentration: Poor  Recall:  Poor  Fund of Knowledge:  Fair  Language:  Fair  Akathisia:  No  Handed:  Right  AIMS (if indicated):     Assets:  Housing Physical Health Resilience Social Support  ADL's:  Impaired  Cognition:  Impaired,  Mild  Sleep:        Treatment Plan Summary: Daily contact with patient to assess and evaluate symptoms and progress in treatment, Medication management and Plan Patient seriously  threatened to kill himself with a firearm today.  Presents as very anxious depressed overwhelmed not thinking clearly.  Patient begs me to let him go home.  Shows poor insight and  judgment.  Support continuing the involuntary commitment.  Orders will be completed for admission to the hospital.  PRN medicine available.  Case reviewed with emergency room physician and TTS.  Disposition: Recommend psychiatric Inpatient admission when medically cleared. Supportive therapy provided about ongoing stressors.  Alethia Berthold, MD 06/04/2017 4:15 PM

## 2017-06-04 NOTE — ED Notes (Signed)
Psychiatrist and TTS in room to evaluate patient at this time.  Will continue to monitor.

## 2017-06-04 NOTE — ED Notes (Signed)
Patient is talking on phone, he is calm and cooperative at this time.

## 2017-06-04 NOTE — ED Triage Notes (Signed)
First Nurse Note:  Arrives with BPD under IVC commitment for psychiatric evaluation.  Patient is currently calm and cooperative.  NAD.  Placed in center of triage with LE for safety.

## 2017-06-04 NOTE — ED Notes (Signed)
Report to include Situation, Background, Assessment, and Recommendations received from Wendy RN. Patient alert and oriented, warm and dry, in no acute distress. Patient denies SI, HI, AVH and pain. Patient made aware of Q15 minute rounds and security cameras for their safety. Patient instructed to come to me with needs or concerns.  

## 2017-06-04 NOTE — BH Assessment (Signed)
[facility] by Dr. Toni Amendlapacs.  Attending Physician will be Dr. Johnella MoloneyMcNew.   Patient [facility] Lillette BoxerGwen F.   ER staff is aware of the admission:  Misty StanleyLisa, ER Kathrynn SpeedSectary   Dr. Fanny BienQuale, ER MD   Rudy JewWendy L., Patient's Nurse   Mertie ClauseJeanelle, Patient Access.

## 2017-06-04 NOTE — ED Provider Notes (Signed)
[facility] Emergency Department Provider Note   ____________________________________________    I have reviewed the triage vital signs and the nursing notes.   HISTORY  Chief Complaint Psychiatric Evaluation     HPI Jack Holmes is a 22 y.o. male who presents with suicidal ideation.  Reportedly the patient put a gun in his mouth today and was thinking about killing himself.  He states that he has been depressed for nearly a month and feels overwhelmed.  His relationship with his wife is strained.  He reports a history of depression, also reports self cutting for stress relief.  He has minor abrasions on his arms.  Denies overdose or substance abuse, apparently had suicidal gesture when he was in eighth grade No past medical history on file.  There are no active problems to display for this patient.   No past surgical history on file.  Prior to Admission medications   Medication Sig Start Date End Date Taking? Authorizing Provider  metoCLOPramide (REGLAN) 5 MG tablet Take 1 tablet (5 mg total) by mouth every 8 (eight) hours as needed for nausea or vomiting. 07/11/16   Menshew, Charlesetta IvoryJenise V Bacon, PA-C  polyethylene glycol (MIRALAX / GLYCOLAX) packet Take 17 g by mouth daily. 06/15/16   Emily FilbertWilliams, Jonathan E, MD  traMADol (ULTRAM) 50 MG tablet Take 1 tablet (50 mg total) by mouth every 6 (six) hours as needed. 07/09/16   Rebecka ApleyWebster, Allison P, MD     Allergies Patient has no known allergies.  No family history on file.  Social History Social History   Tobacco Use  . Smoking status: Light Tobacco Smoker  . Smokeless tobacco: Never Used  Substance Use Topics  . Alcohol use: Yes  . Drug use: No    Review of Systems  Constitutional: No fever/chills Eyes: No visual changes.  ENT: No sore throat. Cardiovascular: Denies chest pain. Respiratory: Denies shortness of breath. Gastrointestinal: No abdominal pain.    Genitourinary: Negative for  dysuria. Musculoskeletal: Negative for back pain. Skin: Negative for rash. Neurological: Negative for headache   ____________________________________________   PHYSICAL EXAM:  VITAL SIGNS: ED Triage Vitals  Enc Vitals Group     BP --      Pulse --      Resp --      Temp 06/04/17 1329 98.3 F (36.8 C)     Temp Source 06/04/17 1329 Oral     SpO2 --      Weight 06/04/17 1331 95.3 kg (210 lb)     Height 06/04/17 1331 1.88 m (6\' 2" )     Head Circumference --      Peak Flow --      Pain Score 06/04/17 1330 3     Pain Loc --      Pain Edu? --      Excl. in GC? --     Constitutional: Alert and oriented. No acute distress. Pleasant and interactive Eyes: Conjunctivae are normal.   Nose: No congestion/rhinnorhea. Mouth/Throat: Mucous membranes are moist.    Cardiovascular: Normal rate, regular rhythm. Grossly normal heart sounds.  Good peripheral circulation. Respiratory: Normal respiratory effort.  No retractions. Lungs CTAB. Gastrointestinal: Soft and nontender. No distention.   Genitourinary: deferred Musculoskeletal:  Warm and well perfused Neurologic:  Normal speech and language. No gross focal neurologic deficits are appreciated.  Skin:  Skin is warm, dry and intact. No rash noted. Psychiatric: Mood and affect are normal. Speech and behavior are normal.  ____________________________________________  LABS (all labs ordered are listed, but only abnormal results are displayed)  Labs Reviewed  COMPREHENSIVE METABOLIC PANEL - Abnormal; Notable for the following components:      Result Value   Glucose, Bld 101 (*)    All other components within normal limits  ACETAMINOPHEN LEVEL - Abnormal; Notable for the following components:   Acetaminophen (Tylenol), Serum <10 (*)    All other components within normal limits  ETHANOL  SALICYLATE LEVEL  CBC  URINE DRUG SCREEN, QUALITATIVE ([facility] ONLY)    ____________________________________________  EKG  None ____________________________________________  RADIOLOGY  None ____________________________________________   PROCEDURES  Procedure(s) performed: No  Procedures   Critical Care performed: No ____________________________________________   INITIAL IMPRESSION / ASSESSMENT AND PLAN / ED COURSE  Pertinent labs & imaging results that were available during my care of the patient were reviewed by me and considered in my medical decision making (see chart for details).  Patient presents with major depressive disorder and suicidal ideation.  I feel he is high risk, I have completed the involuntary commitment form and consult psychiatry  Dr. Toni Amend is seen the patient and recommends admission    ____________________________________________   FINAL CLINICAL IMPRESSION(S) / ED DIAGNOSES  Final diagnoses:  Suicidal ideation        Note:  This document was prepared using Dragon voice recognition software and may include unintentional dictation errors.    Jene Every, MD 06/04/17 [phone]

## 2017-06-04 NOTE — ED Notes (Signed)
Patient states, "I really need to get out of here and go home."  I don't want to be here.  Patient has multiple scratches to his face.

## 2017-06-04 NOTE — Progress Notes (Signed)
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 No signs of AVH.

## 2017-06-04 NOTE — ED Notes (Signed)
Patient exhibits signs of agitation, states that he is going to call His lawyer, and he cannot be in this place, nurse administered ativan and he took without hesitation, states that he just would like to make some phone calls. Denies si/hi or avh at this time, will continue to monitor.

## 2017-06-04 NOTE — Tx Team (Signed)
Initial Treatment Plan 06/04/2017 9:30 PM Jack MorningElijah Holmes BJY:[id number]    PATIENT STRESSORS: Financial difficulties Marital or family conflict Occupational concerns Substance abuse   PATIENT STRENGTHS: Average or above average intelligence Capable of independent living Communication skills General fund of knowledge Motivation for treatment/growth Physical Health   PATIENT IDENTIFIED PROBLEMS: Poor Sleep    Marital Problems    Alcohol Miss Use    Suicide Ideations    Depression     DISCHARGE CRITERIA:  Adequate post-discharge living arrangements Improved stabilization in mood, thinking, and/or behavior Motivation to continue treatment in a less acute level of care Need for constant or close observation no longer present Reduction of life-threatening or endangering symptoms to within safe limits  PRELIMINARY DISCHARGE PLAN: Attend 12-step recovery group Outpatient therapy Participate in family therapy Return to previous living arrangement Return to previous work or school arrangements  PATIENT/FAMILY INVOLVEMENT: This treatment plan has been presented to and reviewed with the patient, Jack Morninglijah Holmes,   The patient  have been given the opportunity to ask questions and make suggestions.  Lelan PonsAlexander  Kache Mcclurg, RN 06/04/2017, 9:30 PM

## 2017-06-05 DIAGNOSIS — F329 Major depressive disorder, single episode, unspecified: Secondary | ICD-10-CM

## 2017-06-05 DIAGNOSIS — F4310 Post-traumatic stress disorder, unspecified: Secondary | ICD-10-CM

## 2017-06-05 LAB — LIPID PANEL
Cholesterol: 163 mg/dL (ref 0–200)
HDL: 41 mg/dL (ref >40)
LDL CALC: 108 mg/dL — AB (ref 0–99)
Total CHOL/HDL Ratio: 4 RATIO
Triglycerides: 69 mg/dL (ref <150)
VLDL: 14 mg/dL (ref 0–40)

## 2017-06-05 LAB — HEMOGLOBIN A1C
Hgb A1c MFr Bld: 4.9 % (ref 4.8–5.6)
Mean Plasma Glucose: 93.93 mg/dL

## 2017-06-05 LAB — TSH: TSH: 2.442 u[IU]/mL (ref 0.350–4.500)

## 2017-06-05 MED ORDER — NICOTINE 21 MG/24HR TD PT24
21.0000 mg | MEDICATED_PATCH | Freq: Every day | TRANSDERMAL | Status: DC
  Administered 2017-06-05 – 2017-06-07 (×3): 21 mg via TRANSDERMAL
  Filled 2017-06-05 (×4): qty 1

## 2017-06-05 MED ORDER — PRAZOSIN HCL 1 MG PO CAPS
1.0000 mg | ORAL_CAPSULE | Freq: Every day | ORAL | Status: DC
  Administered 2017-06-05 – 2017-06-06 (×2): 1 mg via ORAL
  Filled 2017-06-05 (×2): qty 1

## 2017-06-05 NOTE — [facility] Suicide Risk Assessment (Addendum)
[facility] INPATIENT:  Family/Significant Other Suicide Prevention Education  Suicide Prevention Education:  Contact Attempts: Jack FarberMolly Holmes, pt's wife, at [phone]  has been identified by the patient as the family member/significant other with whom the patient will be residing, and identified as the person(s) who will aid the patient in the event of a mental health crisis.  With written consent from the patient, two attempts were made to provide suicide prevention education, prior to and/or following the patient's discharge.  We were unsuccessful in providing suicide prevention education.  A suicide education pamphlet was given to the patient to share with family/significant other.  Date and time of first attempt: 06/05/17 at 10:41AM Date and time of second attempt: 06/06/17 at 0922AM  Heidi DachKelsey Ellora Varnum, LCSW 06/05/2017, 10:43 AM
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
## 2017-06-05 NOTE — Progress Notes (Signed)
Recreation Therapy Notes  INPATIENT RECREATION THERAPY ASSESSMENT  Patient Details Name: Jack Holmes MRN: [id number] DOB: 1995-04-17 Today's Date: 06/05/2017       Information Obtained From: Patient  Able to Participate in Assessment/Interview: Yes  Patient Presentation: Lethargic  Reason for Admission (Per Patient): Suicide Attempt, Suicidal Ideation, Self-injurious Behavior, Substance Abuse  Patient Stressors: Family, Relationship, Friends, Work  Coping Skills:   Other (Comment), Substance Abuse(humor,video game)  Leisure Interests (2+):  Games - Video games(Drinking)  Frequency of Recreation/Participation: Weekly  Awareness of Community Resources:  No  Community Resources:     Current Use:    If no, Barriers?:    Expressed Interest in State Street CorporationCommunity Resource Information: No  [place]  Patient Main Form of Transportation: Walk  Patient Strengths:  None  Patient Identified Areas of Improvement:   Improve drinking habits and depression  Patient Goal for Hospitalization:  To get better and become stable  Current SI (including self-harm):  No  Current HI:  No  Current AVH: No  Staff Intervention Plan: Group Attendance, Collaborate with Interdisciplinary Treatment Team  Consent to Intern Participation: N/A  Heriberto Stmartin 06/05/2017, 2:22 PM

## 2017-06-05 NOTE — BHH Group Notes (Signed)
  06/05/2017  Time: 0900  Type of Therapy and Topic: Group Therapy: Goals Group: SMART Goals   Participation Level:  Did Not Attend   Description of Group:   The purpose of a daily goals group is to assist and guide patients in setting recovery/wellness-related goals. The objective is to set goals as they relate to the crisis in which they were admitted. Patients will be using SMART goal modalities to set measurable goals. Characteristics of realistic goals will be discussed and patients will be assisted in setting and processing how one will reach their goal. Facilitator will also assist patients in applying interventions and coping skills learned in psycho-education groups to the SMART goal and process how one will achieve defined goal.   Therapeutic Goals:  -Patients will develop and document one goal related to or their crisis in which brought them into treatment.  -Patients will be guided by LCSW using SMART goal setting modality in how to set a measurable, attainable, realistic and time sensitive goal.  -Patients will process barriers in reaching goal.  -Patients will process interventions in how to overcome and successful in reaching goal.   Patient's Goal:  Pt was invited to attend group but chose not to attend. CSW will continue to encourage pt to attend group throughout their admission.   Therapeutic Modalities:  Motivational Interviewing  Cognitive Behavioral Therapy  Crisis Intervention Model  SMART goals setting  Heidi DachKelsey Tenise Stetler, MSW, LCSW Clinical Social Worker 06/05/2017 9:46 AM

## 2017-06-05 NOTE — [facility] Group Notes (Signed)
06/05/2017 1PM  Type of Therapy/Topic:  Group Therapy:  Feelings about Diagnosis  Participation Level:  Did Not Attend   Description of Group:   This group will allow patients to explore their thoughts and feelings about diagnoses they have received. Patients will be guided to explore their level of understanding and acceptance of these diagnoses. Facilitator will encourage patients to process their thoughts and feelings about the reactions of others to their diagnosis and will guide patients in identifying ways to discuss their diagnosis with significant others in their lives. This group will be process-oriented, with patients participating in exploration of their own experiences, giving and receiving support, and processing challenge from other group members.   Therapeutic Goals: 1. Patient will demonstrate understanding of diagnosis as evidenced by identifying two or more symptoms of the disorder 2. Patient will be able to express two feelings regarding the diagnosis 3. Patient will demonstrate their ability to communicate their needs through discussion and/or role play  Summary of Patient Progress: Patient was encouraged and invited to attend group. Patient did not attend group. Social worker will continue to encourage group participation in the future.        Therapeutic Modalities:   Cognitive Behavioral Therapy Brief Therapy Feelings Identification    Darreld Hoffer, LCSW 06/05/2017 2:00 PM  

## 2017-06-05 NOTE — Progress Notes (Signed)
   06/05/17 1800  Clinical Encounter Type  Visited With Patient  Visit Type Initial  Referral From Nurse  Consult/Referral To Chaplain  Spiritual Encounters  Spiritual Needs Emotional (spiritual support, connection to God)   Chaplain responded to OR in system for prayer with patient.  Patient provided overview of stressors which led him to current situation.  Conversation around the role of faith in patient's life.  Patient stated that he feels 'like God has turned His back on him.'  Chaplain explored if patient had had this feeling before; patient agreed that he had but 'not to this extent.'  Exploration of how patient has found his way to God in the past.  Discussion of use of prayer, of spending time with the Bible and asking God to find words that speak to patient.  Chaplain provided education on mystic Jacquenette ShoneJulian of [place] and her phrase 'all shall be well, and all shall be well, and all manner of things shall be well' when patient asked for a specific text/passage.  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  Patient expressed that he was expecting visitors and thanked chaplain for her time.
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
## 2017-06-05 NOTE — Progress Notes (Signed)
Recreation Therapy Notes  Date: 04.02.2019  Time: 9:30 am  Location: Craft Room  Behavioral response: N/A  Intervention Topic: Communication  Discussion/Intervention: Patient did not attend group.  Clinical Observations/Feedback:  Patient did not attend group.  Shivansh Hardaway LRT/CTRS          Carla Rashad 06/05/2017 10:49 AM 

## 2017-06-06 MED ORDER — QUETIAPINE FUMARATE 25 MG PO TABS
150.0000 mg | ORAL_TABLET | Freq: Every day | ORAL | Status: DC
  Administered 2017-06-06: 20:00:00 150 mg via ORAL
  Filled 2017-06-06: qty 1

## 2017-06-06 MED ORDER — PRAZOSIN HCL 1 MG PO CAPS: 1.0000 mg | ORAL_CAPSULE | Freq: Every day | ORAL | Status: DC

## 2017-06-06 MED ORDER — TRAZODONE HCL 50 MG PO TABS: 50.0000 mg | ORAL_TABLET | Freq: Every evening | ORAL | Status: DC | PRN

## 2017-06-06 MED ORDER — QUETIAPINE FUMARATE 25 MG PO TABS: 50.0000 mg | ORAL_TABLET | Freq: Every evening | ORAL | Status: DC | PRN

## 2017-06-06 MED ORDER — IBUPROFEN 200 MG PO TABS: 400.0000 mg | ORAL_TABLET | Freq: Four times a day (QID) | ORAL | Status: DC | PRN

## 2017-06-06 MED ORDER — FLUTICASONE PROPIONATE 50 MCG/ACT NA SUSP
2.0000 | Freq: Every day | NASAL | Status: DC
  Administered 2017-06-06 – 2017-06-08 (×3): 2 via NASAL
  Filled 2017-06-06: qty 16

## 2017-06-06 NOTE — Progress Notes (Signed)
Patient ID: Jack Holmes, male   DOB: 05-16-1995, 22 y.o.   MRN: [id number]   CSW reattempted contact with pt's wife, Jack Holmes, at [phone]. Pt's wife did not answer and "has a voicemail box that has not been set up yet." CSW will continue to reattempt.   Heidi DachKelsey Aurther Harlin, MSW, LCSW Clinical Social Worker 06/06/2017 3:23 PM

## 2017-06-06 NOTE — Plan of Care (Addendum)
Patient found in common area visiting with wife upon my arrival. Patient is visible but not social with peers throughout the evening. Upon approach, patient begins assessment stating, "Everybody is saying that I'm not opening up enough and that I will have to stay here until I do. I feel like I've told them everything. Does that mean I will never get out of here? I really want to get out of here." Provided emotional support and encouraged him to ask the treatment team to explain what they mean more clearly to him if he has questions. Denies SI/HI/AVH. Denies urges to cut himself. Reports that the "medicine they give me for my flashbacks is giving me a headache." Given Tylenol for HA 6/10 and left shoulder pain 3/10 with some relief reported. Given Trazodone for sleep with positive results. 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 Patient appears deeply affected by his time in the [employer] and adjustment back to civilian life. Reports eating and voiding adequately. Compliant with HS medications and staff direction. Q 15 minute checks maintained. Will continue to monitor throughout the shift. Patient slept 6.25 hours. No apparent distress. Will endorse care to oncoming shift.   Problem: Self-Concept: Goal: Ability to disclose and discuss suicidal ideas will improve Outcome: Progressing   Problem: Education: Goal: Knowledge of disease or condition will improve Outcome: Progressing   Problem: Coping: Goal: Coping ability will improve Outcome: Progressing Goal: Will verbalize feelings Outcome: Progressing   Problem: Nutrition: Goal: Adequate nutrition will be maintained Outcome: Progressing   Problem: Coping: Goal: Level of anxiety will decrease Outcome: Progressing   Problem: Elimination: Goal: Will not experience complications related to bowel motility Outcome: Progressing
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
## 2017-06-06 NOTE — [facility] Group Notes (Signed)
LCSW Group Therapy Note  06/06/2017 1:00 pm  Type of Therapy/Topic:  Group Therapy:  Emotion Regulation  Participation Level:  Did Not Attend   Description of Group:    The purpose of this group is to assist patients in learning to regulate negative emotions and experience positive emotions. Patients will be guided to discuss ways in which they have been vulnerable to their negative emotions. These vulnerabilities will be juxtaposed with experiences of positive emotions or situations, and patients will be challenged to use positive emotions to combat negative ones. Special emphasis will be placed on coping with negative emotions in conflict situations, and patients will process healthy conflict resolution skills.  Therapeutic Goals: 1. Patient will identify two positive emotions or experiences to reflect on in order to balance out negative emotions 2. Patient will label two or more emotions that they find the most difficult to experience 3. Patient will demonstrate positive conflict resolution skills through discussion and/or role plays  Summary of Patient Progress:  Jack Holmes was invited to today's group, but chose not to attend.     Therapeutic Modalities:   Cognitive Behavioral Therapy Feelings Identification Dialectical Behavioral Therapy

## 2017-06-06 NOTE — Progress Notes (Signed)
Recreation Therapy Notes  Date: 06/06/2017  Time: 9:30 am  Location: Craft Room  Behavioral response: Appropriate  Intervention Topic: Coping Skills  Discussion/Intervention: Group content on today was focused on coping skills. The group defined what coping skills are and when they can be used. Individuals described how they normally cope with thing and the coping skills they normally use. Patients expressed why it is important to cope with things and how not coping with things can affect you. The group participated in the intervention "My coping box" and made coping boxes while adding coping skills they could use in the future to the box. Clinical Observations/Feedback:  Patient came to group and stated fighting and drinking is a negative way to cope with things. He expressed that a positive way he can cope with things is to have limits. Individual was social with peers and staff while participating in the intervention. Meiling Hendriks LRT/CTRS         Marcel Gary 06/06/2017 12:40 PM

## 2017-06-06 NOTE — Plan of Care (Signed)
Patient is alert and oriented X 4. Patient denies SI, HI and AVH today. Patient is pleasant but very anxious. Patient states his goal is working on getting discharged. Patient is compliant with medications and attends groups, and appetite is good. Patient is able to verbalize feelings to staff and knowledgeable about medications. Patient complained of having a headache which tylenol was given and provided relief.  Nurse will continue to monitor and Q 15 minute safety checks will continue. Problem: Spiritual Needs Goal: Ability to function at adequate level Outcome: Progressing   Problem: Education: Goal: Ability to make informed decisions regarding treatment will improve Outcome: Progressing   Problem: Health Behavior/Discharge Planning: Goal: Identification of resources available to assist in meeting health care needs will improve Outcome: Progressing   Problem: Self-Concept: Goal: Ability to disclose and discuss suicidal ideas will improve Outcome: Progressing Goal: Will verbalize positive feelings about self Outcome: Progressing   Problem: Education: Goal: Knowledge of disease or condition will improve Outcome: Progressing Goal: Understanding of discharge needs will improve Outcome: Progressing

## 2017-06-06 NOTE — Tx Team (Addendum)
Interdisciplinary Treatment and Diagnostic Plan Update  06/06/2017 Time of Session: 1100 Jack Holmes MRN: [id number]  Principal Diagnosis: Major depressive disorder, single episode with mixed features  Secondary Diagnoses: Principal Problem:   Major depressive disorder, single episode with mixed features Active Problems:   PTSD (post-traumatic stress disorder)   Current Medications:  Current Facility-Administered Medications  Medication Dose Route Frequency Provider Last Rate Last Dose  . acetaminophen (TYLENOL) tablet 650 mg  650 mg Oral Q6H PRN Clapacs, Jackquline DenmarkJohn T, MD   650 mg at 06/06/17 1007  . alum & mag hydroxide-simeth (MAALOX/MYLANTA) 200-200-20 MG/5ML suspension 30 mL  30 mL Oral Q4H PRN Clapacs, John T, MD      . fluticasone (FLONASE) 50 MCG/ACT nasal spray 2 spray  2 spray Each Nare Daily McNew, Ileene HutchinsonHolly R, MD   2 spray at 06/06/17 1117  . hydrOXYzine (ATARAX/VISTARIL) tablet 50 mg  50 mg Oral Q4H PRN Clapacs, John T, MD      . ibuprofen (ADVIL,MOTRIN) tablet 400 mg  400 mg Oral Q6H PRN McNew, Holly R, MD      . magnesium hydroxide (MILK OF MAGNESIA) suspension 30 mL  30 mL Oral Daily PRN Clapacs, John T, MD      . nicotine (NICODERM CQ - dosed in mg/24 hours) patch 21 mg  21 mg Transdermal Daily McNew, Ileene HutchinsonHolly R, MD   21 mg at 06/06/17 0910  . [START ON 06/07/2017] prazosin (MINIPRESS) capsule 1 mg  1 mg Oral QHS McNew, Holly R, MD      . QUEtiapine (SEROQUEL) tablet 100 mg  100 mg Oral QHS Clapacs, Jackquline DenmarkJohn T, MD   100 mg at 06/05/17 2116  . traZODone (DESYREL) tablet 50 mg  50 mg Oral QHS PRN McNew, Ileene HutchinsonHolly R, MD       PTA Medications: Medications Prior to Admission  Medication Sig Dispense Refill Last Dose  . metoCLOPramide (REGLAN) 5 MG tablet Take 1 tablet (5 mg total) by mouth every 8 (eight) hours as needed for nausea or vomiting. 15 tablet 0   . polyethylene glycol (MIRALAX / GLYCOLAX) packet Take 17 g by mouth daily. 14 each 0   . traMADol (ULTRAM) 50 MG tablet Take 1 tablet (50  mg total) by mouth every 6 (six) hours as needed. 12 tablet 0     Patient Stressors: Financial difficulties Marital or family conflict Occupational concerns Substance abuse  Patient Strengths: Average or above average intelligence Capable of independent living Wellsite geologistCommunication skills General fund of knowledge Motivation for treatment/growth Physical Health  Treatment Modalities: Medication Management, Group therapy, Case management,  1 to 1 session with clinician, Psychoeducation, Recreational therapy.   Physician Treatment Plan for Primary Diagnosis: Major depressive disorder, single episode with mixed features Long Term Goal(s): Improvement in symptoms so as ready for discharge   Short Term Goals: Ability to disclose and discuss suicidal ideas  Medication Management: Evaluate patient's response, side effects, and tolerance of medication regimen.  Therapeutic Interventions: 1 to 1 sessions, Unit Group sessions and Medication administration.  Evaluation of Outcomes: Progressing  Physician Treatment Plan for Secondary Diagnosis: Principal Problem:   Major depressive disorder, single episode with mixed features Active Problems:   PTSD (post-traumatic stress disorder)  Long Term Goal(s): Improvement in symptoms so as ready for discharge   Short Term Goals: Ability to disclose and discuss suicidal ideas     Medication Management: Evaluate patient's response, side effects, and tolerance of medication regimen.  Therapeutic Interventions: 1 to 1 sessions, Unit Group sessions and  Medication administration.  Evaluation of Outcomes: Progressing   RN Treatment Plan for Primary Diagnosis: Major depressive disorder, single episode with mixed features Long Term Goal(s): Knowledge of disease and therapeutic regimen to maintain health will improve  Short Term Goals: Ability to participate in decision making will improve, Ability to identify and develop effective coping behaviors will  improve and Compliance with prescribed medications will improve  Medication Management: RN will administer medications as ordered by provider, will assess and evaluate patient's response and provide education to patient for prescribed medication. RN will report any adverse and/or side effects to prescribing provider.  Therapeutic Interventions: 1 on 1 counseling sessions, Psychoeducation, Medication administration, Evaluate responses to treatment, Monitor vital signs and CBGs as ordered, Perform/monitor CIWA, COWS, AIMS and Fall Risk screenings as ordered, Perform wound care treatments as ordered.  Evaluation of Outcomes: Progressing   LCSW Treatment Plan for Primary Diagnosis: Major depressive disorder, single episode with mixed features Long Term Goal(s): Safe transition to appropriate next level of care at discharge, Engage patient in therapeutic group addressing interpersonal concerns.  Short Term Goals: Engage patient in aftercare planning with referrals and resources, Increase emotional regulation and Increase skills for wellness and recovery  Therapeutic Interventions: Assess for all discharge needs, 1 to 1 time with Social worker, Explore available resources and support systems, Assess for adequacy in community support network, Educate family and significant other(s) on suicide prevention, Complete Psychosocial Assessment, Interpersonal group therapy.  Evaluation of Outcomes: Progressing   Progress in Treatment: Attending groups: Yes. Participating in groups: Yes. Taking medication as prescribed: Yes. Toleration medication: Yes. Family/Significant other contact made: No, will contact:  attempted contact with pt's wife x2. Patient understands diagnosis: Yes. Discussing patient identified problems/goals with staff: Yes. Medical problems stabilized or resolved: Yes. Denies suicidal/homicidal ideation: Yes. Issues/concerns per patient self-inventory: No. Other: None at this time.    New problem(s) identified: No, Describe:  none at this time.  New Short Term/Long Term Goal(s): Pt reported his goal for treatment is to, "get myself stable."   Discharge Plan or Barriers: Pt will discharge home and will continue tx with [facility] upon discharge from [facility]. The only barrier to discharge at this time is CSW not being able to get in touch with pt's wife to ensure firearms have been removed from pt's home.   Reason for Continuation of Hospitalization: Anxiety Depression Medication stabilization  Estimated Length of Stay: 2-3 days   Recreational Therapy: Patient Stressors: Family, Relationship, Friends, Work Patient Goal: Patient will identify 3 positive replacements for unhealthy/harmful habits within 5 recreation therapy group sessions  Attendees: Patient: Jack Holmes  06/06/2017 11:24 AM  Physician: Dr. Johnella Moloney, MD 06/06/2017 11:24 AM  Nursing: Milas Hock, RN  06/06/2017 11:24 AM  RN Care Manager: 06/06/2017 11:24 AM  Social Worker: Heidi Dach, LCSW 06/06/2017 11:24 AM  Recreational Therapist: Garret Reddish, CTRS-LRT 06/06/2017 11:24 AM  Other: Johny Shears, LCSWA 06/06/2017 11:24 AM  Other: Matilde Bash, LCSW 06/06/2017 11:24 AM  Other: 06/06/2017 11:24 AM     Scribe for Treatment Team: Heidi Dach, LCSW 06/06/2017 11:30 AM

## 2017-06-06 NOTE — Plan of Care (Addendum)
Patient found in common area visiting with wife upon my arrival. Orlene ErmConversation between the two appeared slightly contentious but patient was not forthcoming about that. Patient is visible but not social with peers. Patient is minimally verbal throughout our interaction. Mood is "fine." Affect is contemplative/stern. Compliant with HS medication and staff direction. Patient went to bed immediately after administration of medication. Q 15 minute checks maintained. Will continue to monitor throughout the shift.  Patient slept 7 hours. No apparent distress. Will endorse care to oncoming shift.  Problem: Spiritual Needs Goal: Ability to function at adequate level Outcome: Progressing   Problem: Education: Goal: Ability to make informed decisions regarding treatment will improve Outcome: Progressing   Problem: Self-Concept: Goal: Ability to disclose and discuss suicidal ideas will improve Outcome: Progressing   Problem: Education: Goal: Knowledge of disease or condition will improve Outcome: Progressing   Problem: Safety: Goal: Ability to remain free from injury will improve Outcome: Progressing   Problem: Coping: Goal: Coping ability will improve Outcome: Progressing

## 2017-06-07 MED ORDER — PRAZOSIN HCL 2 MG PO CAPS: 2.0000 mg | ORAL_CAPSULE | Freq: Every day | ORAL | 0 refills | Status: DC

## 2017-06-07 MED ORDER — QUETIAPINE FUMARATE 200 MG PO TABS: 200.0000 mg | ORAL_TABLET | Freq: Every day | ORAL | 0 refills | Status: DC

## 2017-06-07 MED ORDER — QUETIAPINE FUMARATE 200 MG PO TABS
200.0000 mg | ORAL_TABLET | Freq: Every day | ORAL | Status: DC
  Administered 2017-06-07: 200 mg via ORAL
  Filled 2017-06-07: qty 1

## 2017-06-07 MED ORDER — PRAZOSIN HCL 2 MG PO CAPS
2.0000 mg | ORAL_CAPSULE | Freq: Every day | ORAL | Status: DC
  Administered 2017-06-07: 2 mg via ORAL
  Filled 2017-06-07: qty 1

## 2017-06-07 NOTE — Progress Notes (Signed)
Recreation Therapy Notes  Date: 06/07/2017  Time: 9:30 am  Location: Craft Room  Behavioral response: Appropriate  Intervention Topic: Team Work  Discussion/Intervention: Group content on today was focused on teamwork. The group identified what teamwork is. Individuals described who is a part of their team. Patients expressed why they thought teamwork is important. The group stated reasons why they thought it was easier to work with a Comptrollersmaller/larger team. Individuals discussed some positives and negatives of working with a team. Patients gave examples of past experiences they had while working with a team. The group participated in the intervention "Story in a bag", patients were in groups and were able to test their skill in a team setting.   Clinical Observations/Feedback:  Patient came to group and define team work as a squad that complete. He expressed that his wife is apart of his team. Individual stated team work is situational but can be a good or bad thing. Patient described honesty, loyalty, and logical as traits he looks for in a team  Brittley Regner LRT/CTRS         Efrem Pitstick 06/07/2017 11:37 AM

## 2017-06-07 NOTE — [facility] Group Notes (Signed)
[facility] Group Notes:  (Nursing/MHT/Case Management/Adjunct)  Date:  06/07/2017  Time:  9:39 PM  Type of Therapy:  Group Therapy  Participation Level:  Active  Participation Quality:  Appropriate  Affect:  Appropriate  Cognitive:  Alert  Insight:  Good  Engagement in Group:  Engaged  Modes of Intervention:  Activity  Summary of Progress/Problems:  Jack Holmes 06/07/2017, 9:39 PM

## 2017-06-07 NOTE — Plan of Care (Addendum)
Patient found in day room visiting with wife upon my arrival. Patient is visible and social throughout the evening. Patient mood and affect are improved. Patient is smiling and pleasant throughout interaction. Denies SI/HI/AVH. Reports mood is much improved and he feels ready for discharge. Visit with wife was happy and animated as opposed to last night's contentiousness. Patient observed encouraging roommate to come out of room to socialize with him. Denies pain. Reports eating and voiding adequately. Compliant with HS medications and staff direction. Q 15 minute checks maintained. Will continue to monitor throughout the shift. Patient slept 7.5 hours. No apparent distress. Will endorse care to oncoming shift.  Problem: Spiritual Needs Goal: Ability to function at adequate level Outcome: Progressing   Problem: Education: Goal: Ability to make informed decisions regarding treatment will improve Outcome: Progressing   Problem: Health Behavior/Discharge Planning: Goal: Identification of resources available to assist in meeting health care needs will improve Outcome: Progressing   Problem: Self-Concept: Goal: Ability to disclose and discuss suicidal ideas will improve Outcome: Progressing Goal: Will verbalize positive feelings about self Outcome: Progressing   Problem: Education: Goal: Knowledge of disease or condition will improve Outcome: Progressing Goal: Understanding of discharge needs will improve Outcome: Progressing   Problem: Health Behavior/Discharge Planning: Goal: Ability to identify changes in lifestyle to reduce recurrence of condition will improve Outcome: Progressing Goal: Identification of resources available to assist in meeting health care needs will improve Outcome: Progressing   Problem: Safety: Goal: Ability to remain free from injury will improve Outcome: Progressing   Problem: Coping: Goal: Coping ability will improve Outcome: Progressing Goal: Will  verbalize feelings Outcome: Progressing   Problem: Activity: Goal: Will identify at least one activity in which they can participate Outcome: Progressing   Problem: Self-Concept: Goal: Will verbalize positive feelings about self Outcome: Progressing   Problem: Education: Goal: Knowledge of General Education information will improve Outcome: Progressing   Problem: Health Behavior/Discharge Planning: Goal: Ability to manage health-related needs will improve Outcome: Progressing   Problem: Clinical Measurements: Goal: Ability to maintain clinical measurements within normal limits will improve Outcome: Progressing Goal: Will remain free from infection Outcome: Progressing Goal: Diagnostic test results will improve Outcome: Progressing Goal: Respiratory complications will improve Outcome: Progressing Goal: Cardiovascular complication will be avoided Outcome: Progressing   Problem: Activity: Goal: Risk for activity intolerance will decrease Outcome: Progressing   Problem: Nutrition: Goal: Adequate nutrition will be maintained Outcome: Progressing   Problem: Coping: Goal: Level of anxiety will decrease Outcome: Progressing   Problem: Elimination: Goal: Will not experience complications related to bowel motility Outcome: Progressing Goal: Will not experience complications related to urinary retention Outcome: Progressing   Problem: Pain Managment: Goal: General experience of comfort will improve Outcome: Progressing   Problem: Safety: Goal: Ability to remain free from injury will improve Outcome: Progressing   Problem: Skin Integrity: Goal: Risk for impaired skin integrity will decrease Outcome: Progressing
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
## 2017-06-07 NOTE — [facility] Suicide Risk Assessment (Signed)
[facility]   Principal Problem: Major depressive disorder, single episode with mixed features Discharge Diagnoses:  Patient Active Problem List   Diagnosis Date Noted  . Major depressive disorder, single episode with mixed features [F32.9] 06/05/2017    Priority: High  . PTSD (post-traumatic stress disorder) [F43.10] 06/05/2017    Priority: High  . Alcohol abuse [F10.10] 06/04/2017     Mental Status Per Nursing Assessment::   On Admission:  Intention to act on suicide plan  Demographic Factors:  Male and Caucasian  Loss Factors: Financial problems/change in socioeconomic status  Historical Factors: Prior suicide attempts and Impulsivity  Risk Reduction Factors:   Sense of responsibility to family, Employed, Living with another person, especially a relative and Positive social support  Continued Clinical Symptoms:  Depression:   Comorbid alcohol abuse/dependence Impulsivity  Cognitive Features That Contribute To Risk:  None    Suicide Risk:  Mild Acute risk:  Suicidal ideation of limited frequency, intensity, duration, and specificity.  There are no identifiable plans, no associated intent, mild dysphoria and related symptoms, good self-control (both objective and subjective assessment), few other risk factors, and identifiable protective factors, including available and accessible social support. Chronic elevated risk.   Follow-up Information    [facility] Go on [facility].   Why:  Please go to your hospital follow up appointment on Friday, 06/15/17 at 2PM. Thank you!  Contact information: [address] [place] [address] [phone]           Plan Of Care/Follow-up recommendations: [facility]. Strongly recommend psychotherapy and outpatient treatment for alcohol use.   Haskell RilingHolly R Teresia Myint, MD 06/07/2017, 1:01 PM

## 2017-06-07 NOTE — Plan of Care (Signed)
Patient is alert and oriented X 4. Patient denies SI, HI and AVH. Today patient is more interactive with staff, talkative, smiling and joking with peers and staff. Patient is compliant with medications, participating in groups and activities. Patient states his goal is to work on himself and he believes the groups are helping him to feel better. Patient rates pain 0/10. Nurse will continue to monitor. 15 minute safety checks to continue. Problem: Spiritual Needs Goal: Ability to function at adequate level Outcome: Progressing   Problem: Health Behavior/Discharge Planning: Goal: Identification of resources available to assist in meeting health care needs will improve Outcome: Progressing   Problem: Self-Concept: Goal: Ability to disclose and discuss suicidal ideas will improve Outcome: Progressing Goal: Will verbalize positive feelings about self Outcome: Progressing   Problem: Education: Goal: Knowledge of disease or condition will improve Outcome: Progressing Goal: Understanding of discharge needs will improve Outcome: Progressing

## 2017-06-07 NOTE — Progress Notes (Signed)
[facility]  06/07/2017 12:55 PM Jack Holmes  MRN:  [id number] Subjective:  Pt states that he is doing well today. Affect appears much brighter and less anxious today. He has been social with peers and playing cards in the miliu most of the day. He is making much greater effort to get help and improve his symptoms. He is much more open with talking. He had a visit with his wife last night. She is still worried about him but he told her that he definitely plans to pursue therapy which made her happy. HE plans to go to his appointment next week and also pursue outpatient alcohol treatment with [facility]. He states that he is sleeping much better with medications and flashbacks and nightmares are improving but still there. He denies SI or any thoughts of cutting or self harm yesterday or today. Denies HI. He has been very calm on the unit with no agitation. He does not appear psychotic or manic.   Principal Problem: Major depressive disorder, single episode with mixed features Diagnosis:   Patient Active Problem List   Diagnosis Date Noted  . Major depressive disorder, single episode with mixed features [F32.9] 06/05/2017    Priority: High  . PTSD (post-traumatic stress disorder) [F43.10] 06/05/2017    Priority: High  . Alcohol abuse [F10.10] 06/04/2017   Total Time spent with patient: 20 minutes  Past Psychiatric History: See H&p  Past Medical History: History reviewed. No pertinent past medical history. History reviewed. No pertinent surgical history. Family History: History reviewed. No pertinent family history. Family Psychiatric  History: See H&P Social History:  Social History   Substance and Sexual Activity  Alcohol Use Yes     Social History   Substance and Sexual Activity  Drug Use No    Social History   Socioeconomic History  . Marital status: Single    Spouse name: Not on file  . Number of children: Not on file  . Years of education: Not on file  . Highest  education level: Not on file  Occupational History  . Not on file  Social Needs  . Financial resource strain: Not on file  . Food insecurity:    Worry: Not on file    Inability: Not on file  . Transportation needs:    Medical: Not on file    Non-medical: Not on file  Tobacco Use  . Smoking status: Light Tobacco Smoker  . Smokeless tobacco: Never Used  Substance and Sexual Activity  . Alcohol use: Yes  . Drug use: No  . Sexual activity: Yes    Birth control/protection: Condom  Lifestyle  . Physical activity:    Days per week: Not on file    Minutes per session: Not on file  . Stress: Not on file  Relationships  . Social connections:    Talks on phone: Not on file    Gets together: Not on file    Attends religious service: Not on file    Active member of club or organization: Not on file    Attends meetings of clubs or organizations: Not on file    Relationship status: Not on file  Other Topics Concern  . Not on file  Social History Narrative  . Not on file   Additional Social History:                         Sleep: Good  Appetite:  Good  Current Medications: Current  Facility-Administered Medications  Medication Dose Route Frequency Provider Last Rate Last Dose  . acetaminophen (TYLENOL) tablet 650 mg  650 mg Oral Q6H PRN Clapacs, Jackquline Denmark, MD   650 mg at 06/06/17 1007  . alum & mag hydroxide-simeth (MAALOX/MYLANTA) 200-200-20 MG/5ML suspension 30 mL  30 mL Oral Q4H PRN Clapacs, John T, MD      . fluticasone (FLONASE) 50 MCG/ACT nasal spray 2 spray  2 spray Each Nare Daily Quest Tavenner, Ileene Hutchinson, MD   2 spray at 06/07/17 1610  . hydrOXYzine (ATARAX/VISTARIL) tablet 50 mg  50 mg Oral Q4H PRN Clapacs, John T, MD      . ibuprofen (ADVIL,MOTRIN) tablet 400 mg  400 mg Oral Q6H PRN Kden Wagster, Ileene Hutchinson, MD      . magnesium hydroxide (MILK OF MAGNESIA) suspension 30 mL  30 mL Oral Daily PRN Clapacs, John T, MD      . nicotine (NICODERM CQ - dosed in mg/24 hours) patch 21 mg  21  mg Transdermal Daily Carly Sabo, Ileene Hutchinson, MD   21 mg at 06/07/17 9604  . prazosin (MINIPRESS) capsule 2 mg  2 mg Oral QHS Kailoni Vahle R, MD      . QUEtiapine (SEROQUEL) tablet 200 mg  200 mg Oral QHS Elizah Mierzwa R, MD      . QUEtiapine (SEROQUEL) tablet 50 mg  50 mg Oral QHS PRN Daeja Helderman, Ileene Hutchinson, MD        Lab Results: No results found for this or any previous visit (from the past 48 hour(s)).  Blood Alcohol level:  Lab Results  Component Value Date   ETH <10 06/04/2017   ETH <5 10/16/2016    Metabolic Disorder Labs: Lab Results  Component Value Date   HGBA1C 4.9 06/05/2017   MPG 93.93 06/05/2017   No results found for: PROLACTIN Lab Results  Component Value Date   CHOL 163 06/05/2017   TRIG 69 06/05/2017   HDL 41 06/05/2017   CHOLHDL 4.0 06/05/2017   VLDL 14 06/05/2017   LDLCALC 108 (H) 06/05/2017    Physical Findings: AIMS: Facial and Oral Movements Muscles of Facial Expression: None, normal Lips and Perioral Area: None, normal Jaw: None, normal Tongue: None, normal,Extremity Movements Upper (arms, wrists, hands, fingers): None, normal Lower (legs, knees, ankles, toes): None, normal, Trunk Movements Neck, shoulders, hips: None, normal, Overall Severity Severity of abnormal movements (highest score from questions above): None, normal Incapacitation due to abnormal movements: None, normal Patient's awareness of abnormal movements (rate only patient's report): No Awareness, Dental Status Current problems with teeth and/or dentures?: No Does patient usually wear dentures?: No  CIWA:  CIWA-Ar Total: 5 COWS:  COWS Total Score: 3  Musculoskeletal: Strength & Muscle Tone: within normal limits Gait & Station: normal Patient leans: N/A  Psychiatric Specialty Exam: Physical Exam  Nursing note and vitals reviewed.   Review of Systems  Neurological: Negative for headaches.  All other systems reviewed and are negative.   Blood pressure 115/75, pulse 85, temperature 97.7  F (36.5 C), temperature source Oral, resp. rate 16, height 6\' 2"  (1.88 m), weight 95.3 kg (210 lb), SpO2 100 %.Body mass index is 26.96 kg/m.  General Appearance: Casual  Eye Contact:  Minimal  Speech:  Clear and Coherent  Volume:  Normal  Mood:  Euthymic  Affect:  Appropriate  Thought Process:  Coherent and Goal Directed  Orientation:  Full (Time, Place, and Person)  Thought Content:  Logical  Suicidal Thoughts:  No  Homicidal Thoughts:  No  Memory:  Immediate;   Fair  Judgement:  Fair  Insight:  Fair  Psychomotor Activity:  Normal  Concentration:  Attention Span: Fair  Recall:  [employer] of Knowledge:  Fair  Language:  Fair  Akathisia:  No      Assets:  Resilience  ADL's:  Intact  Cognition:  WNL  Sleep:  Number of Hours: 6.25     Treatment Plan Summary: 22 yo male admitted due to SI. Mood is improving. He is much more open in conversation. He has been very social in the milieu and laughing and joking with his roommate while playing cards. HE is much more motivated to pursue treatment and therapy.   Plan:  PTSD, MDD with mixed features -Increase Seroquel to 200 mg qhs to better target mood symptoms -Increase Prazosin to 2 mg qhs for nightmares  Alcohol abuse -Discussed that he needs to abstain from alcohol use as this worsens mood and can exacerbate impulsivity. He expressed understanding dn plans to pursue outpatient treatment with [facility]  Dispo -he will return to his wife when stable. I spoke with his wife yesterday. She assured me taht she removed all guns from the home. She will be picking him up tomorrow. He will follow up with [facility] next week.    Haskell Riling, MD 06/07/2017, 12:55 PM

## 2017-06-07 NOTE — [facility] Group Notes (Signed)
06/07/2017  Time: 1PM  Type of Therapy/Topic:  Group Therapy:  Balance in Life  Participation Level:  Active  Description of Group:   This group will address the concept of balance and how it feels and looks when one is unbalanced. Patients will be encouraged to process areas in their lives that are out of balance and identify reasons for remaining unbalanced. Facilitators will guide patients in utilizing problem-solving interventions to address and correct the stressor making their life unbalanced. Understanding and applying boundaries will be explored and addressed for obtaining and maintaining a balanced life. Patients will be encouraged to explore ways to assertively make their unbalanced needs known to significant others in their lives, using other group members and facilitator for support and feedback.  Therapeutic Goals: 1. Patient will identify two or more emotions or situations they have that consume much of in their lives. 2. Patient will identify signs/triggers that life has become out of balance:  3. Patient will identify two ways to set boundaries in order to achieve balance in their lives:  4. Patient will demonstrate ability to communicate their needs through discussion and/or role plays  Summary of Patient Progress: Pt continues to work towards their tx goals but has not yet reached them. Pt was able to appropriately participate in group discussion, and was able to offer support/validation to other group members. 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

## 2017-06-08 NOTE — Progress Notes (Signed)
Pleasant and cooperative. Bright affect.  Denies SI/HI/AVH. Discharge instructions given, verbalized understanding.  Prescriptions given and personal belongings returned.  Escorted off unit by this Clinical research associatewriter to meet fiance to travel home.

## 2017-06-08 NOTE — Progress Notes (Signed)
Recreation Therapy Notes  INPATIENT RECREATION TR PLAN  Patient Details Name: Jack Holmes MRN: [id number] DOB: Aug 30, 1995 Today's Date: 06/08/2017  Rec Therapy Plan Is patient appropriate for Therapeutic Recreation?: Yes Treatment times per week: at least 3 Estimated Length of Stay: 5-7 days TR Treatment/Interventions: Group participation (Comment)  Discharge Criteria Pt will be discharged from therapy if:: Discharged Treatment plan/goals/alternatives discussed and agreed upon by:: Patient/family  Discharge Summary Short term goals set: Patient will identify 3 positive replacements for unhealthy/harmful habits within 5 recreation therapy group sessions Short term goals met: Adequate for discharge Progress toward goals comments: Groups attended Which groups?: Leisure education, Coping skills, Other (Comment)(Team Work) Reason goals not met: N/A Therapeutic equipment acquired: N.A Reason patient discharged from therapy: Discharge from hospital Pt/family agrees with progress & goals achieved: Yes Date patient discharged from therapy: 06/08/17   Cheyenne Schumm 06/08/2017, 12:59 PM

## 2017-06-08 NOTE — Progress Notes (Signed)
Recreation Therapy Notes  Date: 06/08/2017  Time: 9:30 am  Location: Craft Room  Behavioral response: Appropriate  Intervention Topic: Leisure  Discussion/Intervention: Group content today was focused on leisure. The group defined what leisure is and some positive leisure activities they participate in. Individuals identified the difference between good and bad leisure. Participants expressed how they feel after participating in the leisure of their choice. The group discussed how they go about picking a leisure activity and if others are involved in their leisure activities. The patient stated how many leisure activities they too choose from and reasons why it is important to have leisure time. Individuals participated in the intervention "Leisure Jeopardy" where they had a chance to identify new leisure activities as well as benefits of leisure. Clinical Observations/Feedback:  Patient came to group and define leisure and rest and relaxation time. He stated that he normally plays video games during his free time. Individual discussed participating in leisure alone or in group depending on the situation. Patient participated in the intervention and was social with peers and staff during group.  Lezette Kitts LRT/CTRS         Alben Jepsen 06/08/2017 10:28 AM

## 2017-06-08 NOTE — Progress Notes (Signed)
  [facility] :  Will you be returning to the same living situation after discharge:  Yes,  returning home. At discharge, do you have transportation home?: Yes,  pt's wife. Do you have the ability to pay for your medications: Yes,  insurance.   Release of information consent forms completed and in the chart;  Patient's signature needed at discharge.  Patient to Follow up at: Follow-up Information    [facility] Go on 06/15/2017.   Why:  Please go to your hospital follow up appointment on Friday, 06/15/17 at 2PM. Thank you!  Contact information: [address] [place] [address] [phone]           Next level of care provider has access to [facility] Link:no  Safety Planning and Suicide Prevention discussed: Yes,  with pt.   Have you used any form of tobacco in the last 30 days? (Cigarettes, Smokeless Tobacco, Cigars, and/or Pipes): Yes  Has patient been referred to the Quitline?: Patient refused referral  Patient has been referred for addiction treatment: N/A  Heidi DachKelsey Kiernan Farkas, LCSW 06/08/2017, 9:26 AM

## 2017-09-21 ENCOUNTER — Encounter (HOSPITAL_COMMUNITY): Payer: Self-pay | Admitting: Emergency Medicine

## 2017-09-21 ENCOUNTER — Emergency Department (HOSPITAL_COMMUNITY)
Admission: EM | Admit: 2017-09-21 | Discharge: 2017-09-22 | Disposition: A | Discharge status: Other Acute Inpatient Hospital | Payer: Self-pay | Attending: Emergency Medicine | Admitting: Emergency Medicine

## 2017-09-21 DIAGNOSIS — F32A Depression, unspecified: Secondary | ICD-10-CM

## 2017-09-21 DIAGNOSIS — R45851 Suicidal ideations: Secondary | ICD-10-CM

## 2017-09-21 DIAGNOSIS — F329 Major depressive disorder, single episode, unspecified: Secondary | ICD-10-CM

## 2017-09-21 DIAGNOSIS — F17200 Nicotine dependence, unspecified, uncomplicated: Secondary | ICD-10-CM | POA: Insufficient documentation

## 2017-09-21 DIAGNOSIS — Z79899 Other long term (current) drug therapy: Secondary | ICD-10-CM | POA: Insufficient documentation

## 2017-09-21 DIAGNOSIS — F332 Major depressive disorder, recurrent severe without psychotic features: Secondary | ICD-10-CM | POA: Insufficient documentation

## 2017-09-21 NOTE — ED Triage Notes (Signed)
Pt arrives with police for suicidal ideation, attempted suicide by trying to hand himself but he was too heavy for the door he used to hang up noose. Reports he used a razor to cut his arms as well. Reports suicidal ideation for 2-3 days after "my woman left me." Reports 9 beers today.   Staffing notified for sitter need.

## 2017-09-21 NOTE — ED Notes (Addendum)
wanded by security at this time. Police remain at bedside

## 2017-09-22 ENCOUNTER — Other Ambulatory Visit: Payer: Self-pay

## 2017-09-22 ENCOUNTER — Encounter (HOSPITAL_COMMUNITY): Payer: Self-pay

## 2017-09-22 ENCOUNTER — Inpatient Hospital Stay (HOSPITAL_COMMUNITY)
Admission: AD | Admit: 2017-09-22 | Discharge: 2017-09-27 | DRG: 885 | Disposition: A | Discharge status: Home / Self Care | Payer: Federal, State, Local not specified - Other | Source: Intra-hospital | Attending: Psychiatry | Admitting: Psychiatry

## 2017-09-22 DIAGNOSIS — Z79899 Other long term (current) drug therapy: Secondary | ICD-10-CM | POA: Diagnosis not present

## 2017-09-22 DIAGNOSIS — F332 Major depressive disorder, recurrent severe without psychotic features: Principal | ICD-10-CM | POA: Diagnosis present

## 2017-09-22 DIAGNOSIS — F988 Other specified behavioral and emotional disorders with onset usually occurring in childhood and adolescence: Secondary | ICD-10-CM | POA: Diagnosis present

## 2017-09-22 DIAGNOSIS — Z915 Personal history of self-harm: Secondary | ICD-10-CM | POA: Diagnosis not present

## 2017-09-22 DIAGNOSIS — R45851 Suicidal ideations: Secondary | ICD-10-CM | POA: Diagnosis present

## 2017-09-22 DIAGNOSIS — Z811 Family history of alcohol abuse and dependence: Secondary | ICD-10-CM | POA: Diagnosis not present

## 2017-09-22 DIAGNOSIS — F10239 Alcohol dependence with withdrawal, unspecified: Secondary | ICD-10-CM | POA: Diagnosis present

## 2017-09-22 DIAGNOSIS — F17200 Nicotine dependence, unspecified, uncomplicated: Secondary | ICD-10-CM | POA: Diagnosis present

## 2017-09-22 DIAGNOSIS — G4700 Insomnia, unspecified: Secondary | ICD-10-CM | POA: Diagnosis present

## 2017-09-22 DIAGNOSIS — Z818 Family history of other mental and behavioral disorders: Secondary | ICD-10-CM

## 2017-09-22 DIAGNOSIS — F4310 Post-traumatic stress disorder, unspecified: Secondary | ICD-10-CM | POA: Diagnosis present

## 2017-09-22 DIAGNOSIS — F10288 Alcohol dependence with other alcohol-induced disorder: Secondary | ICD-10-CM | POA: Diagnosis present

## 2017-09-22 LAB — SALICYLATE LEVEL: Salicylate Lvl: <7 mg/dL (ref 2.8–30.0)

## 2017-09-22 LAB — COMPREHENSIVE METABOLIC PANEL
ALT: 17 U/L (ref 0–44)
AST: 21 U/L (ref 15–41)
Albumin: 3.9 g/dL (ref 3.5–5.0)
Alkaline Phosphatase: 84 U/L (ref 38–126)
Anion gap: 12 (ref 5–15)
BUN: 7 mg/dL (ref 6–20)
CO2: 28 mmol/L (ref 22–32)
Calcium: 9.4 mg/dL (ref 8.9–10.3)
Chloride: 106 mmol/L (ref 98–111)
Creatinine, Ser: 1.01 mg/dL (ref 0.61–1.24)
GFR calc non Af Amer: >60 mL/min (ref >60)
GLUCOSE: 109 mg/dL — AB (ref 70–99)
POTASSIUM: 3.7 mmol/L (ref 3.5–5.1)
Sodium: 146 mmol/L — ABNORMAL HIGH (ref 135–145)
Total Bilirubin: 0.5 mg/dL (ref 0.3–1.2)
Total Protein: 6.5 g/dL (ref 6.5–8.1)

## 2017-09-22 LAB — CBC
HEMATOCRIT: 49.6 % (ref 39.0–52.0)
Hemoglobin: 16 g/dL (ref 13.0–17.0)
MCH: 29.7 pg (ref 26.0–34.0)
MCHC: 32.3 g/dL (ref 30.0–36.0)
MCV: 92 fL (ref 78.0–100.0)
Platelets: 265 10*3/uL (ref 150–400)
RBC: 5.39 MIL/uL (ref 4.22–5.81)
RDW: 12.4 % (ref 11.5–15.5)
WBC: 5.3 10*3/uL (ref 4.0–10.5)

## 2017-09-22 LAB — RAPID URINE DRUG SCREEN, HOSP PERFORMED
Amphetamines: NOT DETECTED
BARBITURATES: NOT DETECTED
Benzodiazepines: NOT DETECTED
COCAINE: NOT DETECTED
Opiates: NOT DETECTED
Tetrahydrocannabinol: NOT DETECTED

## 2017-09-22 LAB — ACETAMINOPHEN LEVEL: Acetaminophen (Tylenol), Serum: <10 ug/mL — ABNORMAL LOW (ref 10–30)

## 2017-09-22 LAB — ETHANOL: Alcohol, Ethyl (B): <10 mg/dL (ref <10)

## 2017-09-22 MED ORDER — PRAZOSIN HCL 1 MG PO CAPS
1.0000 mg | ORAL_CAPSULE | Freq: Every day | ORAL | Status: DC
  Administered 2017-09-22 – 2017-09-26 (×5): 1 mg via ORAL
  Filled 2017-09-22 (×3): qty 1
  Filled 2017-09-22: qty 7
  Filled 2017-09-22 (×2): qty 1
  Filled 2017-09-22: qty 7
  Filled 2017-09-22 (×2): qty 1

## 2017-09-22 MED ORDER — NICOTINE 21 MG/24HR TD PT24: 21.0000 mg | MEDICATED_PATCH | Freq: Every day | TRANSDERMAL | Status: DC

## 2017-09-22 MED ORDER — DIPHENHYDRAMINE HCL 25 MG PO CAPS
50.0000 mg | ORAL_CAPSULE | Freq: Four times a day (QID) | ORAL | Status: DC | PRN
  Administered 2017-09-23: 50 mg via ORAL
  Filled 2017-09-22: qty 2

## 2017-09-22 MED ORDER — QUETIAPINE FUMARATE 50 MG PO TABS
50.0000 mg | ORAL_TABLET | Freq: Once | ORAL | Status: AC
  Administered 2017-09-22: 50 mg via ORAL
  Filled 2017-09-22 (×2): qty 1

## 2017-09-22 MED ORDER — ZIPRASIDONE MESYLATE 20 MG IM SOLR: 20.0000 mg | INTRAMUSCULAR | Status: DC | PRN

## 2017-09-22 MED ORDER — LORAZEPAM 1 MG PO TABS: 1.0000 mg | ORAL_TABLET | ORAL | Status: DC | PRN

## 2017-09-22 MED ORDER — ACETAMINOPHEN 325 MG PO TABS
650.0000 mg | ORAL_TABLET | Freq: Four times a day (QID) | ORAL | Status: DC | PRN
  Administered 2017-09-23 – 2017-09-25 (×2): 650 mg via ORAL
  Filled 2017-09-22 (×2): qty 2

## 2017-09-22 MED ORDER — DIPHENHYDRAMINE HCL 50 MG/ML IJ SOLN: 50.0000 mg | Freq: Four times a day (QID) | INTRAMUSCULAR | Status: DC | PRN

## 2017-09-22 MED ORDER — HALOPERIDOL LACTATE 5 MG/ML IJ SOLN: 5.0000 mg | Freq: Four times a day (QID) | INTRAMUSCULAR | Status: DC | PRN

## 2017-09-22 MED ORDER — ALUM & MAG HYDROXIDE-SIMETH 200-200-20 MG/5ML PO SUSP
30.0000 mL | ORAL | Status: DC | PRN
  Administered 2017-09-22: 30 mL via ORAL
  Filled 2017-09-22: qty 30

## 2017-09-22 MED ORDER — TRAZODONE HCL 50 MG PO TABS
50.0000 mg | ORAL_TABLET | Freq: Every evening | ORAL | Status: DC | PRN
  Administered 2017-09-22 – 2017-09-26 (×4): 50 mg via ORAL
  Filled 2017-09-22 (×2): qty 1
  Filled 2017-09-22: qty 7
  Filled 2017-09-22 (×2): qty 1

## 2017-09-22 MED ORDER — SERTRALINE HCL 25 MG PO TABS
25.0000 mg | ORAL_TABLET | Freq: Every day | ORAL | Status: DC
  Administered 2017-09-22 – 2017-09-25 (×4): 25 mg via ORAL
  Filled 2017-09-22 (×6): qty 1

## 2017-09-22 MED ORDER — LORAZEPAM 1 MG PO TABS
2.0000 mg | ORAL_TABLET | Freq: Four times a day (QID) | ORAL | Status: DC | PRN
  Administered 2017-09-23: 2 mg via ORAL
  Filled 2017-09-22: qty 2

## 2017-09-22 MED ORDER — ONDANSETRON HCL 4 MG PO TABS: 4.0000 mg | ORAL_TABLET | Freq: Three times a day (TID) | ORAL | Status: DC | PRN

## 2017-09-22 MED ORDER — LORAZEPAM 2 MG/ML IJ SOLN: 2.0000 mg | Freq: Four times a day (QID) | INTRAMUSCULAR | Status: DC | PRN

## 2017-09-22 MED ORDER — MAGNESIUM HYDROXIDE 400 MG/5ML PO SUSP: 30.0000 mL | Freq: Every day | ORAL | Status: DC | PRN

## 2017-09-22 MED ORDER — ACETAMINOPHEN 500 MG PO TABS
1000.0000 mg | ORAL_TABLET | Freq: Once | ORAL | Status: AC
  Administered 2017-09-22: 1000 mg via ORAL
  Filled 2017-09-22: qty 2

## 2017-09-22 MED ORDER — HALOPERIDOL 5 MG PO TABS
5.0000 mg | ORAL_TABLET | Freq: Four times a day (QID) | ORAL | Status: DC | PRN
  Administered 2017-09-23: 5 mg via ORAL
  Filled 2017-09-22: qty 1

## 2017-09-22 MED ORDER — OLANZAPINE 5 MG PO TBDP
10.0000 mg | ORAL_TABLET | Freq: Three times a day (TID) | ORAL | Status: DC | PRN
Start: 2017-09-22 — End: 2017-09-22

## 2017-09-22 MED ORDER — QUETIAPINE FUMARATE 50 MG PO TABS: 50.0000 mg | ORAL_TABLET | Freq: Every evening | ORAL | Status: DC | PRN

## 2017-09-22 MED ORDER — HYDROXYZINE HCL 25 MG PO TABS
25.0000 mg | ORAL_TABLET | Freq: Three times a day (TID) | ORAL | Status: DC | PRN
  Administered 2017-09-22 – 2017-09-27 (×9): 25 mg via ORAL
  Filled 2017-09-22 (×8): qty 1
  Filled 2017-09-22: qty 10
  Filled 2017-09-22: qty 1

## 2017-09-22 MED ORDER — IBUPROFEN 400 MG PO TABS: 600.0000 mg | ORAL_TABLET | Freq: Three times a day (TID) | ORAL | Status: DC | PRN
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
## 2017-09-22 NOTE — Progress Notes (Signed)
[facility]. Accepting provider is Education officer, communitypencer. The attending doctor is Dr. Jola Babinskilary. AC will call nurse when bed is available.   Moss McKy-Sha Ceriah Kohler MSW, LCSW, LCAS Clinical Social Worker 09/22/2017 9:39 AM

## 2017-09-22 NOTE — ED Notes (Signed)
[facility] and [facility] record; Original 1st Exam placed in red folder in PODF-Monique,RN

## 2017-09-22 NOTE — Tx Team (Signed)
Initial Treatment Plan 09/22/2017 2:03 PM Jack Morninglijah Sunde ZOX:[id number]    PATIENT STRESSORS: Marital or family conflict Occupational concerns Substance abuse   PATIENT STRENGTHS: Religious Affiliation Work skills   PATIENT IDENTIFIED PROBLEMS: Anxiety  Depression  "Get better"  "Help me get over my girlfriend"               DISCHARGE CRITERIA:  Improved stabilization in mood, thinking, and/or behavior Motivation to continue treatment in a less acute level of care Reduction of life-threatening or endangering symptoms to within safe limits Verbal commitment to aftercare and medication compliance  PRELIMINARY DISCHARGE PLAN: Attend aftercare/continuing care group Attend PHP/IOP Outpatient therapy Placement in alternative living arrangements  PATIENT/FAMILY INVOLVEMENT: This treatment plan has been presented to and reviewed with the patient, Jack Holmes, and/or family member.  The patient and family have been given the opportunity to ask questions and make suggestions.  Jack PunchesJane O Lethia Donlon, RN 09/22/2017, 2:03 PM

## 2017-09-22 NOTE — ED Notes (Signed)
Sitter at bedside.

## 2017-09-22 NOTE — ED Notes (Signed)
[place] P[place].

## 2017-09-22 NOTE — ED Notes (Addendum)
ALL belongings - 2 labeled belongings bags and 1 valuables envelope - [employer] - Pt aware.

## 2017-09-22 NOTE — BH Assessment (Signed)
[facility] Assessment Progress Note      Patient was seen for re-assessment.  Patient continues to be depressed, but states that he is not feeling suicidal today.  He states: "I am just trying to deal with this hangover." Patient states that he drank the majority of the day yesterday trying to self-medicate his depression.  Patient states that he did not sleep any at all last night and he does not feel well this morning.  Patient states that he has a history of depression and states that he has been on medications in the past, but states that he stopped taking them because he could not afford them.  Patient states that he has minimal support currently.  Patient states that this is the first time that he has tried to hurt himself.  Due to his current level of depression, the fact that he tried to hurt/kill himself and his lack of support, inpatient treatment continues to be recommended.

## 2017-09-22 NOTE — Progress Notes (Signed)
[facility] reviewing for possible admission per AC.  Eller Sweis, MSW, LCSW Therapeutic Triage Specialist  [phone]  
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
## 2017-09-22 NOTE — ED Provider Notes (Signed)
[facility] EMERGENCY [facility] Provider Note   CSN: [id number] Arrival date & time: 09/21/17  2309     History   Chief Complaint Chief Complaint  Patient presents with  . Suicidal    HPI Jack Morninglijah Myszka is a 22 y.o. male.  Patient presents to the emergency department for evaluation of depression and suicidality.  He reports previous history of depression, now worsening.  He attempted suicide last night.  He reports that he tried to hang himself, but he was "too heavy and the belt slipped off the door and he fell to the ground".  He has been cutting his arms which he has done before as a way to relieve stress.  These were not suicide attempts.     History reviewed. No pertinent past medical history.  Patient Active Problem List   Diagnosis Date Noted  . Major depressive disorder, single episode with mixed features 06/05/2017  . PTSD (post-traumatic stress disorder) 06/05/2017  . Alcohol abuse 06/04/2017    History reviewed. No pertinent surgical history.      Home Medications    Prior to Admission medications   Medication Sig Start Date End Date Taking? Authorizing Provider  prazosin (MINIPRESS) 2 MG capsule Take 1 capsule (2 mg total) by mouth at bedtime. 06/07/17   McNew, Ileene HutchinsonHolly R, MD  QUEtiapine (SEROQUEL) 200 MG tablet Take 1 tablet (200 mg total) by mouth at bedtime. 06/07/17   McNew, Ileene HutchinsonHolly R, MD    Family History History reviewed. No pertinent family history.  Social History Social History   Tobacco Use  . Smoking status: Light Tobacco Smoker  . Smokeless tobacco: Never Used  Substance Use Topics  . Alcohol use: Yes  . Drug use: No     Allergies   Patient has no known allergies.   Review of Systems Review of Systems  Psychiatric/Behavioral: Positive for dysphoric mood, self-injury and suicidal ideas.  All other systems reviewed and are negative.    Physical Exam Updated Vital Signs BP 118/90 (BP Location: Right Arm)   Pulse  74   Temp 98.6 F (37 C) (Oral)   Resp 20   Ht 6\' 2"  (1.88 m)   Wt 106.6 kg (235 lb)   SpO2 97%   BMI 30.17 kg/m   Physical Exam  Constitutional: He is oriented to person, place, and time. He appears well-developed and well-nourished. No distress.  HENT:  Head: Normocephalic and atraumatic.  Right Ear: Hearing normal.  Left Ear: Hearing normal.  Nose: Nose normal.  Mouth/Throat: Oropharynx is clear and moist and mucous membranes are normal.  Eyes: Pupils are equal, round, and reactive to light. Conjunctivae and EOM are normal.  Neck: Normal range of motion. Neck supple. No spinous process tenderness and no muscular tenderness present. Normal range of motion present.  Cardiovascular: Regular rhythm, S1 normal and S2 normal. Exam reveals no gallop and no friction rub.  No murmur heard. Pulmonary/Chest: Effort normal and breath sounds normal. No respiratory distress. He exhibits no tenderness.  Abdominal: Soft. Normal appearance and bowel sounds are normal. There is no hepatosplenomegaly. There is no tenderness. There is no rebound, no guarding, no tenderness at McBurney's point and negative Murphy's sign. No hernia.  Musculoskeletal: Normal range of motion.       Left elbow: He exhibits normal range of motion, no swelling, no effusion and no deformity. Tenderness found. Olecranon process tenderness noted.  Neurological: He is alert and oriented to person, place, and time. He has normal strength.  No cranial nerve deficit or sensory deficit. Coordination normal. GCS eye subscore is 4. GCS verbal subscore is 5. GCS motor subscore is 6.  Skin: Skin is warm, dry and intact. No rash noted. No cyanosis.  Superficial linear abrasions on bilateral forearms  Psychiatric: He has a normal mood and affect. His speech is normal and behavior is normal. Thought content normal.  Nursing note and vitals reviewed.    ED Treatments / Results  Labs (all labs ordered are listed, but only abnormal results  are displayed) Labs Reviewed  COMPREHENSIVE METABOLIC PANEL - Abnormal; Notable for the following components:      Result Value   Sodium 146 (*)    Glucose, Bld 109 (*)    All other components within normal limits  ACETAMINOPHEN LEVEL - Abnormal; Notable for the following components:   Acetaminophen (Tylenol), Serum <10 (*)    All other components within normal limits  ETHANOL  SALICYLATE LEVEL  CBC  RAPID URINE DRUG SCREEN, HOSP PERFORMED    EKG None  Radiology No results found.  Procedures Procedures (including critical care time)  Medications Ordered in ED Medications  OLANZapine zydis (ZYPREXA) disintegrating tablet 10 mg (has no administration in time range)    And  LORazepam (ATIVAN) tablet 1 mg (has no administration in time range)    And  ziprasidone (GEODON) injection 20 mg (has no administration in time range)  ibuprofen (ADVIL,MOTRIN) tablet 600 mg (has no administration in time range)  ondansetron (ZOFRAN) tablet 4 mg (has no administration in time range)  nicotine (NICODERM CQ - dosed in mg/24 hours) patch 21 mg (has no administration in time range)  acetaminophen (TYLENOL) tablet 1,000 mg (has no administration in time range)     Initial Impression / Assessment and Plan / ED Course  I have reviewed the triage vital signs and the nursing notes.  Pertinent labs & imaging results that were available during my care of the patient were reviewed by me and considered in my medical decision making (see chart for details).     Patient presents to the ER for evaluation of depression.  He attempted suicide last night but was unsuccessful.  He attempted hanging, the belt would not support his weight and he fell to the ground.  He does not have any neck pain on examination.  No evidence of head trauma, awake, alert and oriented without any neurologic deficit.  He does complain of left elbow pain, but he reports that that only started while he was in the waiting room.   He declines x-ray.  Examination does not reveal any obvious trauma and no signs of infection.  He was given Tylenol.  Patient will require further psychiatric evaluation.  He is medically clear for psychiatric treatment.  Final Clinical Impressions(s) / ED Diagnoses   Final diagnoses:  Suicidal ideation  Depression, unspecified depression type    ED Discharge Orders    None       Gilda Crease, MD 09/22/17 [phone]

## 2017-09-22 NOTE — ED Notes (Signed)
Dr. Blinda LeatherwoodPollina and [facility] at bedside.

## 2017-09-22 NOTE — ED Notes (Signed)
Patient was offered a snack, but refused.

## 2017-09-22 NOTE — BHH Suicide Risk Assessment (Signed)
[facility] Admission Suicide Risk Assessment   Nursing information obtained from:    Demographic factors:    Current Mental Status:    Loss Factors:    Historical Factors:    Risk Reduction Factors:     Total Time spent with patient: 45 minutes Principal Problem: <principal problem not specified> Diagnosis:   Patient Active Problem List   Diagnosis Date Noted  . MDD (major depressive disorder), recurrent severe, without psychosis (HCC) [F33.2] 09/22/2017  . Major depressive disorder, single episode with mixed features [F32.9] 06/05/2017  . PTSD (post-traumatic stress disorder) [F43.10] 06/05/2017  . Alcohol abuse [F10.10] 06/04/2017   Subjective Data: Patient is seen and examined.  Patient is a 22 year old male with a past psychiatric history significant for major depression, posttraumatic stress disorder as well as attention deficit disorder who was placed under involuntary commitment by [facility] police.  According to the involuntary commitment paperwork "people on [employer] saw the respondent say he was going to kill himself.  They said Mr. Okey Regal said he was unsuccessful the night prior to admission and was going to attempt suicide again today".  Patient stated that he had become significantly depressed after his girlfriend broke up with him, and she moved in with another guy.  Patient stated that after the relationship ended he decided to hang himself with a belt.  Patient stated the belt broke, he lost consciousness for about an hour.  When asked about weapons the patient stated that "while this is [place] so of course I can get a gun".  Patient also admitted to excessive alcohol use for quite a while, and stated that the day prior to admission he drank 9 beers.  Surprisingly his blood alcohol was negative, and his liver function enzymes are normal.  His last psychiatric  hospitalization was at [facility] approximately a year ago.  He was diagnosed with depression at that time.  He was  discharged on prazosin and Seroquel.  He stated he thought he had insurance at that time, but ended up not having it.  He was unable to afford the Seroquel so he did not have it filled.  He was also referred to [facility] in [place].  He stated he went there initially for follow-up, but because he had no insurance they were unable to see me.  He stated he had gone inconsistently to [facility], and was involved in group therapy but had not been consistent with that.  He was admitted to the hospital for evaluation and stabilization.  Continued Clinical Symptoms:    The "Alcohol Use Disorders Identification Test", Guidelines for Use in Primary Care, Second Edition.  World Science writer [facility]). Score between 0-7:  no or low risk or alcohol related problems. Score between 8-15:  moderate risk of alcohol related problems. Score between 16-19:  high risk of alcohol related problems. Score 20 or above:  warrants further diagnostic evaluation for alcohol dependence and treatment.   CLINICAL FACTORS:   Severe Anxiety and/or Agitation Depression:   Aggression Anhedonia Comorbid alcohol abuse/dependence Hopelessness Impulsivity Insomnia Alcohol/Substance Abuse/Dependencies More than one psychiatric diagnosis Previous Psychiatric Diagnoses and Treatments   Musculoskeletal: Strength & Muscle Tone: within normal limits Gait & Station: normal Patient leans: N/A  Psychiatric Specialty Exam: Physical Exam  Nursing note and vitals reviewed. Constitutional: He is oriented to person, place, and time. He appears well-developed and well-nourished.  HENT:  Head: Normocephalic and atraumatic.  Respiratory: Effort normal.  Neurological: He is alert and oriented to person, place, and  time.    ROS  There were no vitals taken for this visit.There is no height or weight on file to calculate BMI.  General Appearance: Casual  Eye Contact:  Minimal  Speech:  Normal Rate  Volume:  Normal   Mood:  Anxious, Dysphoric and Irritable  Affect:  Congruent  Thought Process:  Coherent  Orientation:  Full (Time, Place, and Person)  Thought Content:  Logical  Suicidal Thoughts:  Yes.  without intent/plan  Homicidal Thoughts:  No  Memory:  Immediate;   Fair Recent;   Fair Remote;   Fair  Judgement:  Impaired  Insight:  Lacking  Psychomotor Activity:  Increased and Restlessness  Concentration:  Concentration: Fair and Attention Span: Fair  Recall:  Fis[employer]Fair  Fund of Knowledge:  Fair  Language:  Fair  Akathisia:  Negative  Handed:  Right  AIMS (if indicated):     Assets:  Desire for Improvement Housing Physical Health Resilience  ADL's:  Intact  Cognition:  WNL  Sleep:         COGNITIVE FEATURES THAT CONTRIBUTE TO RISK:  None    SUICIDE RISK: Moderate   PLAN OF CARE: Patient is seen and examined.  Patient is a 22 year old male with the above-stated past psychiatric history was admitted after suicide attempt and suicidal ideation.  He has a history of major depression as well as posttraumatic stress disorder.  He also has an alcohol use disorder.  He denied signs or symptoms of bipolar disorder, but had [facility] on prazosin as well as Seroquel.  He stated he had not been treated with antidepressant medications in the past.  He stated that he felt as though the Seroquel helped a bit, but had not been on it long enough to really decide that.  He stated he was unable to afford it because the lack of insurance.  In the short run him and giving 50 mg of Seroquel right now just to take the tension out.  I am also going to restart his prazosin at bedtime.  I am going to put him on Zoloft 25 mg p.o. daily and be cautious with that given my concern for bipolar disorder.  He will also be placed on a benzodiazepine taper for alcohol withdrawal.  He will be placed on 15-minute checks for safety as well as seizure and detox issues.  Hopefully we can  get some collateral information, and figure out what we can do to help him.  He will be integrated into the milieu.  He will see social work both individually and also in groups.  We will encourage him to work on coping skills.  We will encourage him to work on substance issues.  I certify that inpatient services furnished can reasonably be expected to improve the patient's condition.   Antonieta PertGreg Lawson Rumaldo Difatta, MD 09/22/2017, 1:30 PM

## 2017-09-22 NOTE — Progress Notes (Signed)
Pt recommended for inpt treatment per Jack SievertSpencer Simon, PA. EDP Jack Holmes, Jack Brimhristopher J, MD and RN have been advised of the disposition. TTS to seek placement.  Jack BruinsAquicha Harlen Holmes, MSW, LCSW Therapeutic Triage Specialist  [phone]

## 2017-09-22 NOTE — ED Notes (Signed)
Patient's belongings inventoried and placed in [facility] and [facility]

## 2017-09-22 NOTE — Progress Notes (Signed)
D: Pt was in bed in his room upon initial approach.  Pt presents with depressed affect and mood.  He forwards little information to Clinical research associatewriter.  Describes his day as "all right" and reports goal is "just to sleep well."  Pt denies SI/HI, denies hallucinations, denies pain.  Pt has been isolative to his room for the majority of the evening.  He [facility] evening group [facility]    A: Introduced self to pt.  Actively listened to pt and offered support and encouragement. Medications administered per order.  Medication education provided.  PRN medication administered for anxiety and sleep.  Fall prevention techniques reviewed with pt and pt verbalized understanding.  Q15 minute safety checks maintained.  R: Pt is safe on the unit.  Pt is compliant with medications.  Pt verbally contracts for safety.  Will continue to monitor and assess.

## 2017-09-22 NOTE — ED Notes (Signed)
Per IVC paperwork completed by police, pt was live steaming on the internet that he wanted to kill himself. Per paperwork,  Pt was unsuccessful in his suicide attempt yesterday and planned another attempt today.

## 2017-09-22 NOTE — ED Notes (Signed)
Pt has not been moved to hall bed from triage yet as previous patient is still in hall bed.

## 2017-09-22 NOTE — [facility] Counselor (Addendum)
Adult Comprehensive Assessment  Patient ID: Jack Holmes, male   DOB: [birth year], 22 y.o.   MRN: [id number]  Information Source: Information source: Patient  Current Stressors:  Patient states their primary concerns and needs for treatment are:: "If I'm ever going to get over myself or not." Patient states their goals for this hospitilization and ongoing recovery are:: Generally getting better Educational / Learning stressors: Denies stressors Employment / Job issues: People are getting laid off a lot. Family Relationships: Denies stressors Financial / Lack of resources (include bankruptcy): Always worried about being able to afford rent, food, power Housing / Lack of housing: Ex-girlfriend was paying most of the bills and wants to move out. Physical health (include injuries & life threatening diseases): Denies stressors Social relationships: With girlfriend over 6 years, and she just broke up with him. Substance abuse: "I'm an alcoholic, but other than that, not really" Bereavement / Loss: Denies stressors  Living/Environment/Situation:  Living Arrangements: Spouse/significant other, Non-relatives/Friends Living conditions (as described by patient or guardian): Okay Who else lives in the home?: Ex-girlfriend, roommate How long has patient lived in current situation?: 1 year What is atmosphere in current home: Comfortable, Other (Comment)(Busy)  Family History:  Marital status: Long term relationship Long term relationship, how long?: 6 years What types of issues is patient dealing with in the relationship?: Girlfriend has ended the relationship as of 3 days ago Are you sexually active?: Yes What is your sexual orientation?: Heterosexual  Has your sexual activity been affected by drugs, alcohol, medication, or emotional stress?: Pt denies.  Does patient have children?: No  Childhood History:  By whom was/is the patient raised?: Mother/father and step-parent Additional childhood  history information: Pt reports being raised by his mother and his step-father.  Description of patient's relationship with caregiver when they were a child: Mother- stepfather - both good; Bio father had a good relationship on weekends Patient's description of current relationship with people who raised him/her: "My relationship with my mother and step-father is excellent. My biolgocial father is a scumbag and I don't talk to him."  How were you disciplined when you got in trouble as a child/adolescent?: "There's a thin line between child abuse and discipline. When I got in trouble, I had to do push ups, go running, things like that."  Does patient have siblings?: Yes Number of Siblings: 5 Description of patient's current relationship with siblings: "I love all of them and they love me."  He is the oldest with 5 younger brothers Did patient suffer any verbal/emotional/physical/sexual abuse as a child?: No Has patient ever been sexually abused/assaulted/raped as an adolescent or adult?: No Was the patient ever a victim of a crime or a disaster?: No Witnessed domestic violence?: No Has patient been effected by domestic violence as an adult?: No  Education:  Highest grade of school patient has completed: Graduated high school Currently a student?: No Learning disability?: No  Employment/Work Situation:   Employment situation: Employed 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 How long has patient been employed?: 2 months Patient's job has been impacted by current illness: Yes Describe how patient's job has been impacted: Sometimes just calls out from work in order to stay at home and do nothing What is the longest time patient has a held a job?: 7 months Where was the patient employed at that time?: [employer] Did You Receive Any Psychiatric Treatment/Services While in [employer]?: No(Was in the [employer] 2-3 years ago, wants to get back in the [employer].)  Are There Guns or Other Weapons in  Your Home?: No  Financial Resources:   Financial resources: Income from employment(No insurance) Does patient have a representative payee or guardian?: No  Alcohol/Substance Abuse:   What has been your use of drugs/alcohol within the last 12 months?: Alcohol - cut down to 1-2 a week until 3 days ago, when he started drinking a lot more.  Yesterday drank 9 beers. If attempted suicide, did drugs/alcohol play a role in this?: Yes Alcohol/Substance Abuse Treatment Hx: Denies past history Has alcohol/substance abuse ever caused legal problems?: No  Social Support System:   Patient's Community Support System: None Describe Community Support System: Used to count ex-girlfriend as his only support, but now has none. Type of faith/religion: Catholic How does patient's faith help to cope with current illness?: "It usually doesn't.  I've strayed pretty far from the churhc."  Leisure/Recreation:   Leisure and Hobbies: "I like to play video games and cook."   Strengths/Needs:   What is the patient's perception of their strengths?: "Right now I don't feel like I have any." Patient states they can use these personal strengths during their treatment to contribute to their recovery: N/A Patient states these barriers may affect/interfere with their treatment: None Patient states these barriers may affect their return to the community: None Other important information patient would like considered in planning for their treatment: N/A  Discharge Plan:   Currently receiving community mental health services: Yes (From Whom)([facility] in [place] - in therapy, supposed to see a psychiatrist) Patient states concerns and preferences for aftercare planning are: Does not have insurance so is on the backlog to see the psychiatrist. Patient states they will know when they are safe and ready for discharge when: "I already said to my doctor I want to be out of here in 72 hours.  I'm usually tight lipped and don't smile,  so I'll let you know." Does patient have access to transportation?: No(Ex-girlfriend can only pick him up Monday or Friday, so otherwise he does not know how he will get home.) Does patient have financial barriers related to discharge medications?: Yes Patient description of barriers related to discharge medications: Income but no insurance, is on the backlog at [facility] for psychiatry Plan for no access to transportation at discharge: Ex-girlfriend can only pick him up Monday or Friday, so if he is discharged another day, he will need help. Will patient be returning to same living situation after discharge?: Yes  Summary/Recommendations:   Summary and Recommendations (to be completed by the evaluator): Patient is a 22yo male admitted under IVC, with a suicide attempt by hanging himself with a belt that broke and intention to try again, posting this intention on-line which led to calls to the [employer] which intervened.  He was previously at [facility] in April 2019.  He states he is an alcoholic and had cut down on his drinking but in last 3 days has drank a lot. Primary stressors include his girlfriend of 6 years breaking up with him, not knowing how he is going to pay his rent/bills with her moving out, fears of being laid off like many others from work, and worries that he will never feel better.  He goes to [facility] for therapy but is on a backlog to see a psychiatrist because he does not have insurance.  He only has a ride home Monday or Friday, is afraid he will be "stuck" here otherwise.  Patient will benefit from crisis stabilization, medication evaluation,  group therapy and psychoeducation, in addition to case management for discharge planning. At discharge it is recommended that Patient adhere to the established discharge plan and continue in treatment.  Lynnell Chad. 09/22/2017

## 2017-09-22 NOTE — ED Notes (Signed)
TTS in process 

## 2017-09-22 NOTE — ED Notes (Signed)
TTS called and questioned pt being in hallway.  Notified them that [facility] within the next 5 minutes.

## 2017-09-22 NOTE — Progress Notes (Signed)
Jack Holmes is a 22 y.o. male involuntary [facility]. Pt stated his girlfriend broke up with him and now is with another man. Pt stated he has known his girlfriend since high school and she the only person has been supportive to him. Pt stated he went on face book and did something bad, then five people call police on him. Pt stated he attempted to hang himself with a belt, but the belt broke. Pt stated they have a history of family  mental illness on the mom side, and alcoholism on both side of his parents. Pt has been cooperative with admission process, alert and oriented x 4, denies SI/HI, AVH at this time. Consents signed, skin/belongings search completed and pt oriented to unit. Pt stable at this time. Pt given the opportunity to express concerns and ask questions. Pt given toiletries. Will continue to monitor.

## 2017-09-22 NOTE — Progress Notes (Signed)
Patient did not attend group.

## 2017-09-23 MED ORDER — DIVALPROEX SODIUM ER 500 MG PO TB24
500.0000 mg | ORAL_TABLET | Freq: Every day | ORAL | Status: DC
  Administered 2017-09-23: 500 mg via ORAL
  Filled 2017-09-23 (×2): qty 1

## 2017-09-23 MED ORDER — LORAZEPAM 2 MG/ML IJ SOLN: 1.0000 mg | Freq: Four times a day (QID) | INTRAMUSCULAR | Status: DC | PRN

## 2017-09-23 MED ORDER — RISPERIDONE 1 MG PO TABS
1.0000 mg | ORAL_TABLET | Freq: Once | ORAL | Status: DC
  Filled 2017-09-23: qty 1

## 2017-09-23 MED ORDER — RISPERIDONE 1 MG PO TABS
1.0000 mg | ORAL_TABLET | Freq: Every day | ORAL | Status: DC
  Administered 2017-09-23: 1 mg via ORAL
  Filled 2017-09-23 (×2): qty 1

## 2017-09-23 MED ORDER — LORAZEPAM 1 MG PO TABS
1.0000 mg | ORAL_TABLET | Freq: Four times a day (QID) | ORAL | Status: DC | PRN
  Administered 2017-09-23: 1 mg via ORAL
  Filled 2017-09-23: qty 1

## 2017-09-23 NOTE — Progress Notes (Signed)
Adult Psychoeducational Group Note  Date:  09/23/2017 Time:  9:27 PM  Group Topic/Focus:  Wrap-Up Group:   The focus of this group is to help patients review their daily goal of treatment and discuss progress on daily workbooks.  Participation Level:  Did not attend  Participation Quality:  Affect:    Cognitive:    Insight:   Engagement in Group:    Modes of Intervention:    Additional Comments: Pt was invited to attend but did not.  Jack Holmes 09/23/2017, 9:27 PM

## 2017-09-23 NOTE — Progress Notes (Signed)
[facility] MD Progress Note  09/23/2017 11:23 AM Jack Holmes  MRN:  [id number] Subjective: Patient is seen and examined.  Patient is a 22 year old male with a past psychiatric history reported to be major depression, posttraumatic stress disorder as well as attention deficit disorder.  He seen in follow-up.  He is very mood dysregulated today.  He is very irritable.  It is unclear whether or not this is secondary to alcohol withdrawal, or perhaps that this is bipolar disorder.  He initially comes in and talks about his girlfriend (who had been hospitalized here before I guess).  He stated that after she got out of the hospital all she talked about was her psychiatric illness, and was unable or unwilling to discuss life.  His speech is pressured, psychomotor agitated.  We discussed alternatives to his current medications.  When we are talking about lithium he stated he had family members who were taking lithium and "I did not like what it did to them".  Clearly his family history is significant for some bipolar/manic symptoms.  He did agree to a trial of Depakote, and because he could not afford the Seroquel we discussed using some Risperdal for transitions.  His labs were reviewed and his liver function enzymes were normal, and his CBC showed no evidence of any anemias.  Blood pressure and heart rate are normal.  He slept about 6 hours last night. Principal Problem: <principal problem not specified> Diagnosis:   Patient Active Problem List   Diagnosis Date Noted  . MDD (major depressive disorder), recurrent severe, without psychosis ([place]) [F33.2] 09/22/2017  . Alcohol dependence with other alcohol-induced disorder ([place]) [F10.288]   . Major depressive disorder, single episode with mixed features [F32.9] 06/05/2017  . Posttraumatic stress disorder [F43.10] 06/05/2017  . Alcohol abuse [F10.10] 06/04/2017   Total Time spent with patient: 30 minutes  Past Psychiatric History: See admission H&P  Past Medical  History: History reviewed. No pertinent past medical history. History reviewed. No pertinent surgical history. Family History: History reviewed. No pertinent family history. Family Psychiatric  History: See admission H&P Social History:  Social History   Substance and Sexual Activity  Alcohol Use Yes     Social History   Substance and Sexual Activity  Drug Use No    Social History   Socioeconomic History  . Marital status: Single    Spouse name: Not on file  . Number of children: Not on file  . Years of education: Not on file  . Highest education level: Not on file  Occupational History  . Not on file  Social Needs  . Financial resource strain: Not on file  . Food insecurity:    Worry: Not on file    Inability: Not on file  . Transportation needs:    Medical: Not on file    Non-medical: Not on file  Tobacco Use  . Smoking status: Light Tobacco Smoker  . Smokeless tobacco: Never Used  Substance and Sexual Activity  . Alcohol use: Yes  . Drug use: No  . Sexual activity: Yes    Birth control/protection: Condom  Lifestyle  . Physical activity:    Days per week: Not on file    Minutes per session: Not on file  . Stress: Not on file  Relationships  . Social connections:    Talks on phone: Not on file    Gets together: Not on file    Attends religious service: Not on file    Active member of club  or organization: Not on file    Attends meetings of clubs or organizations: Not on file    Relationship status: Not on file  Other Topics Concern  . Not on file  Social History Narrative  . Not on file   Additional Social History:    Pain Medications: See MAR Prescriptions: See MAR Over the Counter: See MAR History of alcohol / drug use?: Yes Longest period of sobriety (when/how long): Unable to quantify Negative Consequences of Use: Personal relationships, Financial Withdrawal Symptoms: Other (Comment)(none) Name of Substance 1: Alcohol 1 - Age of First Use:  21 1 - Amount (size/oz): 8 beers 1 - Frequency: occasional, binge drinks under stress 1 - Duration: ongoing 1 - Last Use / Amount: 09/21/17                  Sleep: Fair  Appetite:  Good  Current Medications: Current Facility-Administered Medications  Medication Dose Route Frequency Provider Last Rate Last Dose  . acetaminophen (TYLENOL) tablet 650 mg  650 mg Oral Q6H PRN Money, Lowry Ram, FNP      . alum & mag hydroxide-simeth (MAALOX/MYLANTA) 200-200-20 MG/5ML suspension 30 mL  30 mL Oral Q4H PRN Money, Darnelle Maffucci B, FNP   30 mL at 09/22/17 1432  . diphenhydrAMINE (BENADRYL) capsule 50 mg  50 mg Oral Q6H PRN Money, Lowry Ram, FNP       Or  . diphenhydrAMINE (BENADRYL) injection 50 mg  50 mg Intramuscular Q6H PRN Money, Darnelle Maffucci B, FNP      . divalproex (DEPAKOTE ER) 24 hr tablet 500 mg  500 mg Oral QHS Sharma Covert, MD      . haloperidol (HALDOL) tablet 5 mg  5 mg Oral Q6H PRN Money, Lowry Ram, FNP       Or  . haloperidol lactate (HALDOL) injection 5 mg  5 mg Intramuscular Q6H PRN Money, Lowry Ram, FNP      . hydrOXYzine (ATARAX/VISTARIL) tablet 25 mg  25 mg Oral TID PRN Money, Lowry Ram, FNP   25 mg at 09/22/17 2123  . LORazepam (ATIVAN) tablet 2 mg  2 mg Oral Q6H PRN Money, Lowry Ram, FNP   2 mg at 09/23/17 1111   Or  . LORazepam (ATIVAN) injection 2 mg  2 mg Intramuscular Q6H PRN Money, Darnelle Maffucci B, FNP      . magnesium hydroxide (MILK OF MAGNESIA) suspension 30 mL  30 mL Oral Daily PRN Money, Darnelle Maffucci B, FNP      . prazosin (MINIPRESS) capsule 1 mg  1 mg Oral QHS Sharma Covert, MD   1 mg at 09/22/17 2123  . risperiDONE (RISPERDAL) tablet 1 mg  1 mg Oral Once Sharma Covert, MD      . risperiDONE (RISPERDAL) tablet 1 mg  1 mg Oral QHS Sharma Covert, MD      . sertraline (ZOLOFT) tablet 25 mg  25 mg Oral Daily Sharma Covert, MD   25 mg at 09/23/17 0746  . traZODone (DESYREL) tablet 50 mg  50 mg Oral QHS PRN Money, Lowry Ram, FNP   50 mg at 09/22/17 2123    Lab  Results:  Results for orders placed or performed during the hospital encounter of 09/21/17 (from the past 48 hour(s))  Rapid urine drug screen (hospital performed)     Status: None   Collection Time: 09/21/17 11:32 PM  Result Value Ref Range   Opiates NONE DETECTED NONE DETECTED   Cocaine NONE DETECTED NONE DETECTED  Benzodiazepines NONE DETECTED NONE DETECTED   Amphetamines NONE DETECTED NONE DETECTED   Tetrahydrocannabinol NONE DETECTED NONE DETECTED   Barbiturates NONE DETECTED NONE DETECTED    Comment: (NOTE) DRUG SCREEN FOR MEDICAL PURPOSES ONLY.  IF CONFIRMATION IS NEEDED FOR ANY PURPOSE, NOTIFY LAB WITHIN 5 DAYS. LOWEST DETECTABLE LIMITS FOR URINE DRUG SCREEN Drug Class                     Cutoff (ng/mL) Amphetamine and metabolites    1000 Barbiturate and metabolites    200 Benzodiazepine                 355 Tricyclics and metabolites     300 Opiates and metabolites        300 Cocaine and metabolites        300 THC                            50 Performed at [facility], [place] [address], [place], [place] [address]   Comprehensive metabolic panel     Status: Abnormal   Collection Time: 09/21/17 11:35 PM  Result Value Ref Range   Sodium 146 (H) 135 - 145 mmol/L   Potassium 3.7 3.5 - 5.1 mmol/L   Chloride 106 98 - 111 mmol/L    Comment: Please note change in reference range.   CO2 28 22 - 32 mmol/L   Glucose, Bld 109 (H) 70 - 99 mg/dL    Comment: Please note change in reference range.   BUN 7 6 - 20 mg/dL    Comment: Please note change in reference range.   Creatinine, Ser 1.01 0.61 - 1.24 mg/dL   Calcium 9.4 8.9 - 10.3 mg/dL   Total Protein 6.5 6.5 - 8.1 g/dL   Albumin 3.9 3.5 - 5.0 g/dL   AST 21 15 - 41 U/L   ALT 17 0 - 44 U/L    Comment: Please note change in reference range.   Alkaline Phosphatase 84 38 - 126 U/L   Total Bilirubin 0.5 0.3 - 1.2 mg/dL   GFR calc non Af Amer >60 >60 mL/min   GFR calc Af Amer >60 >60 mL/min    Comment: (NOTE) The eGFR  has been calculated using the CKD EPI equation. This calculation has not been validated in all clinical situations. eGFR's persistently <60 mL/min signify possible Chronic Kidney Disease.    Anion gap 12 5 - 15    Comment: Performed at [facility] [address] [place], [place] [address]  Ethanol     Status: None   Collection Time: 09/21/17 11:35 PM  Result Value Ref Range   Alcohol, Ethyl (B) <10 <10 mg/dL    Comment: (NOTE) Lowest detectable limit for serum alcohol is 10 mg/dL. For medical purposes only. Performed at [facility], [place] [address], [place], [place] [address]   Salicylate level     Status: None   Collection Time: 09/21/17 11:35 PM  Result Value Ref Range   Salicylate Lvl <6.2 2.8 - 30.0 mg/dL    Comment: Performed at [facility] [address] [place], [place] [address]  Acetaminophen level     Status: Abnormal   Collection Time: 09/21/17 11:35 PM  Result Value Ref Range   Acetaminophen (Tylenol), Serum <10 (L) 10 - 30 ug/mL    Comment: (NOTE) Therapeutic concentrations vary significantly. A range of 10-30 ug/mL  may be an effective concentration for many patients. However, some  are best treated at concentrations outside of this range. Acetaminophen concentrations >150 ug/mL at 4 hours after ingestion  and >50 ug/mL at 12 hours after ingestion are often associated with  toxic reactions. Performed at [facility], [place] [address] [place], [place] [address]   cbc     Status: None   Collection Time: 09/21/17 11:35 PM  Result Value Ref Range   WBC 5.3 4.0 - 10.5 K/uL   RBC 5.39 4.22 - 5.81 MIL/uL   Hemoglobin 16.0 13.0 - 17.0 g/dL   HCT 49.6 39.0 - 52.0 %   MCV 92.0 78.0 - 100.0 fL   MCH 29.7 26.0 - 34.0 pg   MCHC 32.3 30.0 - 36.0 g/dL   RDW 12.4 11.5 - 15.5 %   Platelets 265 150 - 400 K/uL    Comment: Performed at [facility], [place] [address], [place], [place] [address]    Blood Alcohol level:  Lab Results   Component Value Date   ETH <10 09/21/2017   ETH <10 41/93/7902    Metabolic Disorder Labs: Lab Results  Component Value Date   HGBA1C 4.9 06/05/2017   MPG 93.93 06/05/2017   No results found for: PROLACTIN Lab Results  Component Value Date   CHOL 163 06/05/2017   TRIG 69 06/05/2017   HDL 41 06/05/2017   CHOLHDL 4.0 06/05/2017   VLDL 14 06/05/2017   LDLCALC 108 (H) 06/05/2017    Physical Findings: AIMS: Facial and Oral Movements Muscles of Facial Expression: None, normal Lips and Perioral Area: None, normal Jaw: None, normal Tongue: None, normal,Extremity Movements Upper (arms, wrists, hands, fingers): None, normal Lower (legs, knees, ankles, toes): None, normal, Trunk Movements Neck, shoulders, hips: None, normal, Overall Severity Severity of abnormal movements (highest score from questions above): None, normal Incapacitation due to abnormal movements: None, normal Patient's awareness of abnormal movements (rate only patient's report): No Awareness, Dental Status Current problems with teeth and/or dentures?: No Does patient usually wear dentures?: No  CIWA:  CIWA-Ar Total: 0 COWS:  COWS Total Score: 0  Musculoskeletal: Strength & Muscle Tone: within normal limits Gait & Station: normal Patient leans: N/A  Psychiatric Specialty Exam: Physical Exam  Nursing note and vitals reviewed. Constitutional: He is oriented to person, place, and time. He appears well-developed and well-nourished.  HENT:  Head: Normocephalic and atraumatic.  Respiratory: Effort normal.  Neurological: He is alert and oriented to person, place, and time.    ROS  Blood pressure 114/77, pulse 80, temperature 99.3 F (37.4 C), temperature source Oral, resp. rate 16, height 6' 2" (1.88 m), weight 105.2 kg (232 lb), SpO2 100 %.Body mass index is 29.79 kg/m.  General Appearance: Casual  Eye Contact:  Minimal  Speech:  Pressured  Volume:  Increased  Mood:  Anxious, Dysphoric and Irritable   Affect:  Congruent  Thought Process:  Coherent  Orientation:  Full (Time, Place, and Person)  Thought Content:  Rumination  Suicidal Thoughts:  Yes.  without intent/plan  Homicidal Thoughts:  No  Memory:  Immediate;   Fair Recent;   Fair Remote;   Fair  Judgement:  Impaired  Insight:  Lacking  Psychomotor Activity:  Increased  Concentration:  Concentration: Poor and Attention Span: Poor  Recall:  [employer] of Knowledge:  Fair  Language:  Good  Akathisia:  Negative  Handed:  Right  AIMS (if indicated):     Assets:  Desire  for Improvement Housing Physical Health Resilience  ADL's:  Intact  Cognition:  WNL  Sleep:  Number of Hours: 6.75     Treatment Plan Summary: Daily contact with patient to assess and evaluate symptoms and progress in treatment, Medication management and Plan Patient is seen and examined.  Patient is a 22 year old male with the above-stated past psychiatric history seen in follow-up.  His irritability and anger is pretty prominent today.  I am concerned about underlying bipolar disorder as well as PTSD.  We discussed his medications today, and working to make some changes.  I am going to give him 1 mg of Risperdal this morning to try and calm him down a bit, and place him on 1 mg p.o. nightly.  We will start Depakote 500 mg p.o. nightly starting tonight.  The Seroquel will be stopped.  The sertraline will be continued.  He also complained of continued nightmares and flashbacks, so I will increase his prazosin to 2 mg p.o. nightly.  He continues to be monitored for alcohol withdrawal symptoms.  His vital signs are stable this morning.  Sharma Covert, MD 09/23/2017, 11:23 AM

## 2017-09-23 NOTE — Progress Notes (Signed)
D Pt presents with pressured, agitated stance. HE does not make eye contact. His speech has urgency. He is incongruent, ie his words express one feeling and his behavior expresses another one.     A HE did complete his daily assessment and on this he wrote he denied having SI today. HE rated his depression, hopelessness and anxiety " 5/5/6", respectively. HE presented to this writer at 1100 and requested " something for my anxiety". He denied having hallucinations and agreed he would try the 2 mg ativan prn that was ordered. Approx 1 hr later- on his way to eat lunch he complained of feeling lightheaded and "weird". He was encouraged by Clinical research associatewriter to lay down. VS were checked and he was able to walk to cafe for lunch. Upon returning to the unit after eating lunch, he again approached the writer and said he felt tired and wanted to sleep. He was assisted by Clinical research associatewriter to return to his room, he got into his bed, where he slept approx 3 hrs. Dr. Jola Babinskilary was notified and scheduled resperdal was held due to pt level of sedation.     R Safety in place.

## 2017-09-23 NOTE — Plan of Care (Signed)
  Problem: Safety: Goal: Periods of time without injury will increase Outcome: Progressing Note:  Pt has not harmed self or others tonight.  He denies SI/HI and verbally contracts for safety.   

## 2017-09-23 NOTE — [facility] Group Notes (Signed)
[facility] LCSW Group Therapy Note  Date/Time:  09/23/2017 9:00-10:00 or 10:00-11:00AM  Type of Therapy and Topic:  Group Therapy:  Healthy and Unhealthy Supports  Participation Level:  Active   Description of Group:  Patients in this group were introduced to the idea of adding a variety of healthy supports to address the various needs in their lives.Patients discussed what additional healthy supports could be helpful in their recovery and wellness after discharge in order to prevent future hospitalizations.   An emphasis was placed on using counselor, doctor, therapy groups, 12-step groups, and problem-specific support groups to expand supports.  They also worked as a group on developing a specific plan for several patients to deal with unhealthy supports through boundary-setting, psychoeducation with loved ones, and even termination of relationships.   Therapeutic Goals:   1)  discuss importance of adding supports to stay well once out of the hospital  2)  compare healthy versus unhealthy supports and identify some examples of each  3)  generate ideas and descriptions of healthy supports that can be added  4)  offer mutual support about how to address unhealthy supports  5)  encourage active participation in and adherence to discharge plan    Summary of Patient Progress:  The patient stated that current healthy supports in his life are only his dog while current unhealthy supports include everyone else in his life.  The patient expressed no willingness to add support(s) to help in his recovery journey, but was instead angry and monopolized group at two different times to discuss his disagreement with group leader statements.  First he disagreed that human beings have to first take care of themselves, stating he always took care of everybody else.  He was able to agree that this had not worked out for him, as he is now hospitalized with depression and suicidal ideation, but he was insufficiently  insightful to be able to see that his lack of focus on his own wellness may have contributed to his deteriorating psychiatric symptoms.  He next became angry when group leader talked about referring to one's mental health diagnosis as a disorder they "have" as opposed to claiming it as their identity, in other words, saying "I have Bipolar Disorder" instead of saying "I'm Bipolar."  He said that many people and organizations instruct people like his ex-girlfriend to get out of life duties by using one's disorder as an excuse.  He lacked insight, was unable to comprehend what group leader and other group members tried to explain to him.  He was obviously agitated throughout group, as evidenced by his affect, verbal tension, and tight muscles, as well as getting up and down numerous times, leaving the room at times.   Therapeutic Modalities:   Motivational Interviewing Brief Solution-Focused Therapy  Ambrose MantleMareida Grossman-Orr, LCSW

## 2017-09-24 MED ORDER — DIVALPROEX SODIUM ER 250 MG PO TB24
750.0000 mg | ORAL_TABLET | Freq: Every day | ORAL | Status: DC
  Administered 2017-09-24 – 2017-09-25 (×2): 750 mg via ORAL
  Filled 2017-09-24 (×3): qty 3

## 2017-09-24 NOTE — Progress Notes (Addendum)
Pt was agitated, anxious, tearful shortly after change of shift.  Writer met with pt 1:1.  Pt told writer that his "ex" came to visit and "she had to be escorted out of here."  He reports she told pt that they would not be getting back together.  Pt reports he told his ex "if I die, my blood is on your hands."  When asked if pt is suicidal, he states "I'm going to have to say no to that."  Writer was concerned about pt because he was very tearful, loud, slightly tremulous, and expressing hopelessness because of the loss of his relationship.  Pt repeatedly denied SI and contracted for safety with Probation officer.  Pt was clenching his fists at times during conversation.  Pt was dismissive when Probation officer suggested multiple coping skills to him.  He stated "I've heard it all before, I've tried it."  He denies HI, denies hallucinations, and complains of abdominal pain of 6/10.  PRN medication for agitation and severe anxiety was administered.  Later in the evening, pt was seen smiling and laughing while talking to male peer on unit.  He reports his mood is somewhat improved.  He requested and received PRN medication for sleep and pain.  Medication administered per order.  Fall prevention techniques reviewed with pt and pt verbalized understanding.   Pt is compliant with medications.  He verbally contracts for Probation officer multiple times.  He is safe on the unit.  Will continue to monitor and assess.

## 2017-09-24 NOTE — Progress Notes (Signed)
D: Pt was at nurse's station upon initial approach.  Pt presents with anxious affect and mood.  He states "I wish I knew when I was going home."  Pt was encouraged to discuss potential discharge date with treatment team.  His goal is to "not break down like I did last night."  Pt denies SI/HI, denies hallucinations, denies pain.  Pt has been visible in milieu interacting with peers and staff appropriately.  Pt attended evening group.    A: Introduced self to pt.  Actively listened to pt and offered support and encouragement. Medication administered per order.  PRN medication administered for anxiety.  Q15 minute safety checks maintained.  R: Pt is safe on the unit.  Pt is compliant with medications.  Pt verbally contracts for safety.  Will continue to monitor and assess.

## 2017-09-24 NOTE — Tx Team (Signed)
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 acetaminophen (TYLENOL) tablet 650 mg  650 mg Oral Q6H PRN Money, Gerlene Burdockravis B, FNP   650 mg at 09/23/17 2013  . alum & mag hydroxide-simeth (MAALOX/MYLANTA) 200-200-20 MG/5ML suspension 30 mL  30 mL Oral Q4H PRN Money, Feliz Beamravis B, FNP   30 mL at 09/22/17 1432  . diphenhydrAMINE (BENADRYL) capsule 50 mg  50 mg Oral Q6H PRN Money, Gerlene Burdockravis B, FNP   50 mg at 09/23/17 2003   Or  . diphenhydrAMINE (BENADRYL) injection 50 mg  50 mg Intramuscular Q6H PRN Money, Feliz Beamravis B, FNP      . divalproex (DEPAKOTE ER) 24 hr tablet 750 mg  750 mg Oral QHS Antonieta Pertlary, Greg Lawson, MD      . haloperidol (HALDOL) tablet 5 mg  5 mg Oral Q6H PRN Money, Gerlene Burdockravis B, FNP   5 mg at 09/23/17 2003   Or  . haloperidol lactate (HALDOL) injection 5 mg  5 mg Intramuscular Q6H PRN Money, Gerlene Burdockravis B, FNP      . hydrOXYzine (ATARAX/VISTARIL) tablet 25 mg  25 mg Oral TID PRN Money, Gerlene Burdockravis B, FNP   25 mg at 09/24/17 1315  . LORazepam (ATIVAN) tablet 1 mg  1 mg Oral Q6H PRN Antonieta Pertlary, Greg Lawson, MD   1 mg at 09/23/17 2003   Or  . LORazepam (ATIVAN) injection 1 mg  1 mg Intramuscular Q6H PRN Antonieta Pertlary, Greg Lawson, MD      . magnesium hydroxide (MILK OF MAGNESIA) suspension 30 mL  30 mL Oral Daily PRN Money, Feliz Beamravis B, FNP      . prazosin (MINIPRESS) capsule 1 mg  1 mg Oral QHS Antonieta Pertlary, Greg Lawson, MD   1 mg at 09/23/17 2111  . risperiDONE (RISPERDAL) tablet 1 mg  1 mg Oral Once Antonieta Pertlary, Greg Lawson, MD      . sertraline (ZOLOFT) tablet 25 mg   25 mg Oral Daily Antonieta Pertlary, Greg Lawson, MD   25 mg at 09/24/17 0827  . traZODone (DESYREL) tablet 50 mg  50 mg Oral QHS PRN Money, Gerlene Burdockravis B, FNP   50 mg at 09/23/17 2154   PTA Medications: Medications Prior to Admission  Medication Sig Dispense Refill Last Dose  . acetaminophen (TYLENOL) 500 MG tablet Take 1,000 mg by mouth every 6 (six) hours as needed for moderate pain.   unk  . prazosin (MINIPRESS) 2 MG capsule Take 1 capsule (2 mg total) by mouth at bedtime. (Patient not taking: Reported on 09/22/2017) 30 capsule 0 Not Taking at Unknown time  . QUEtiapine (SEROQUEL) 200 MG tablet Take 1 tablet (200 mg total) by mouth at bedtime. (Patient not taking: Reported on 09/22/2017) 30 tablet 0 Not Taking at Unknown time    Patient Stressors: Marital or family conflict Occupational concerns Substance abuse  Patient Strengths: Religious Affiliation Work skills  Treatment Modalities: Medication Management, Group therapy, Case management,  1 to 1 session with clinician, Psychoeducation, Recreational therapy.   Physician Treatment Plan for Primary Diagnosis: <principal problem not specified> Long Term Goal(s): Improvement in symptoms so as ready for discharge Improvement in symptoms so as ready for  discharge   Short Term Goals: Ability to identify changes in lifestyle to reduce recurrence of condition will improve Ability to verbalize feelings will improve Ability to disclose and discuss suicidal ideas Ability to demonstrate self-control will improve Ability to identify and develop effective coping behaviors will improve Ability to maintain clinical measurements within normal limits will improve Compliance with prescribed medications will improve Ability to identify triggers associated with substance abuse/mental health issues will improve Ability to identify changes in lifestyle to reduce recurrence of condition will improve Ability to verbalize feelings will improve Ability to disclose and  discuss suicidal ideas Ability to demonstrate self-control will improve Ability to identify and develop effective coping behaviors will improve Ability to maintain clinical measurements within normal limits will improve Compliance with prescribed medications will improve Ability to identify triggers associated with substance abuse/mental health issues will improve  Medication Management: Evaluate patient's response, side effects, and tolerance of medication regimen.  Therapeutic Interventions: 1 to 1 sessions, Unit Group sessions and Medication administration.  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)  Long Term Goal(s): Improvement in symptoms so as ready for discharge Improvement in symptoms so as ready for discharge   Short Term Goals: Ability to identify changes in lifestyle to reduce recurrence of condition will improve Ability to verbalize feelings will improve Ability to disclose and discuss suicidal ideas Ability to demonstrate self-control will improve Ability to identify and develop effective coping behaviors will improve Ability to maintain clinical measurements within normal limits will improve Compliance with prescribed medications will improve Ability to identify triggers associated with substance abuse/mental health issues will improve Ability to identify changes in lifestyle to reduce recurrence of condition will improve Ability to verbalize feelings will improve Ability to disclose and discuss suicidal ideas Ability to demonstrate self-control will improve Ability to identify and develop effective coping behaviors will improve Ability to maintain clinical measurements within normal limits will improve Compliance with prescribed  medications will improve Ability to identify triggers associated with substance abuse/mental health issues will improve     Medication Management: Evaluate patient's response, side effects, and tolerance of medication regimen.  Therapeutic Interventions: 1 to 1 sessions, Unit Group sessions and Medication administration.  Evaluation of Outcomes: Progressing   RN Treatment Plan for Primary Diagnosis: <principal problem not specified> Long Term Goal(s): Knowledge of disease and therapeutic regimen to maintain health will improve  Short Term Goals: Ability to identify and develop effective coping behaviors will improve and Compliance with prescribed medications will improve  Medication Management: RN will administer medications as ordered by provider, will assess and evaluate patient's response and provide education to patient for prescribed medication. RN will report any adverse and/or side effects to prescribing provider.  Therapeutic Interventions: 1 on 1 counseling sessions, Psychoeducation, Medication administration, Evaluate responses to treatment, Monitor vital signs and CBGs as ordered, Perform/monitor CIWA, COWS, AIMS and Fall Risk screenings as ordered, Perform wound care treatments as ordered.  Evaluation of Outcomes: Progressing   LCSW Treatment Plan for Primary Diagnosis: <principal problem not specified> Long Term Goal(s): Safe transition to appropriate next level of care at discharge, Engage patient in therapeutic group addressing interpersonal concerns.  Short Term Goals: Engage patient in aftercare planning with referrals and resources, Increase social support and Increase skills for wellness and recovery  Therapeutic Interventions: Assess for all  discharge needs, 1 to 1 time with Child psychotherapist, Explore available resources and support systems, Assess for adequacy in community support network, Educate family and significant other(s) on suicide prevention, Complete Psychosocial  Assessment, Interpersonal group therapy.  Evaluation of Outcomes: Progressing   Progress in Treatment: Attending groups: Yes. Participating in groups: Yes. Taking medication as prescribed: Yes. Toleration medication: Yes. Family/Significant other contact made: No, will contact:  girlfriend Patient understands diagnosis: Yes. Discussing patient identified problems/goals with staff: Yes. Medical problems stabilized or resolved: Yes. Denies suicidal/homicidal ideation: Yes. Issues/concerns per patient self-inventory: No. Other: none  New problem(s) identified: No, Describe:  none  New Short Term/Long Term Goal(s):  Patient Goals:  "to better deal with things that have happened to me."  Discharge Plan or Barriers:   Reason for Continuation of Hospitalization: Depression Medication stabilization  Estimated Length of Stay: 2-4 days.  Attendees: Patient: Jack Holmes 09/24/2017   Physician: Dr. Jola Babinski, MD 09/24/2017   Nursing: Norma Fredrickson, RN 09/24/2017   RN Care Manager: 09/24/2017   Social Worker: Daleen Squibb, LCSW 09/24/2017   Recreational Therapist:  09/24/2017   Other:  09/24/2017   Other:  09/24/2017   Other: 09/24/2017        Scribe for Treatment Team: Lorri Frederick, LCSW 09/24/2017 3:10 PM

## 2017-09-24 NOTE — Plan of Care (Signed)
  Problem: Activity: Goal: Sleeping patterns will improve Outcome: Progressing Note:  Pt slept 6.75 hours last night.   

## 2017-09-24 NOTE — Progress Notes (Signed)
Recreation Therapy Notes  Date: 7.22.19 Time: 0930 Location: [address] Dayroom  Group Topic: Stress Management  Goal Area(s) Addresses:  Patient will verbalize importance of using healthy stress management.  Patient will identify positive emotions associated with healthy stress management.   Intervention: Stress Management  Activity :  Guided Imagery.  LRT introduced the stress management technique of guided imagery.  LRT read a script that allowed patients to envision floating on a cloud.  Patients were to listen and follow along as LRT read script to engage in the activity.  Education:  Stress Management, Discharge Planning.   Education Outcome: Acknowledges edcuation/In group clarification offered/Needs additional education  Clinical Observations/Feedback: Pt did not attend group.     Caroll RancherMarjette Dakotah Orrego, LRT/CTRS         Lillia AbedLindsay, Yasmene Salomone A 09/24/2017 1:51 PM

## 2017-09-24 NOTE — Progress Notes (Signed)
[facility] MD Progress Note  09/24/2017 11:30 AM Jack Holmes  MRN:  [id number] Subjective: Patient is seen and examined.  Patient is a 22 year old male with a past psychiatric history reported to be major depression, posttraumatic stress disorder, possible bipolar disorder as well as attention deficit disorder and alcohol use disorder.  He is seen in follow-up.  Yesterday was not a good day.  He was very agitated and irritable.  He received PRN's to try and keep him from getting more agitated.  He stated this a.m. that he has accepted the fact that he and his girlfriend are broken up for good.  He stated that he feels much less aggressive today.  He received Depakote last night as well as some Risperdal.  He denied any suicidal ideation this a.m.  He continues to question about discharge, and I told him that it would be better if we tied 2 days together that were good versus sending him out giving how bad his day was yesterday.  He did state that he felt very sedated today. Principal Problem: <principal problem not specified> Diagnosis:   Patient Active Problem List   Diagnosis Date Noted  . MDD (major depressive disorder), recurrent severe, without psychosis (HCC) [F33.2] 09/22/2017  . Alcohol dependence with other alcohol-induced disorder (HCC) [F10.288]   . Major depressive disorder, single episode with mixed features [F32.9] 06/05/2017  . Posttraumatic stress disorder [F43.10] 06/05/2017  . Alcohol abuse [F10.10] 06/04/2017   Total Time spent with patient: 20 minutes  Past Psychiatric History: See admission H&P  Past Medical History: History reviewed. No pertinent past medical history. History reviewed. No pertinent surgical history. Family History: History reviewed. No pertinent family history. Family Psychiatric  History: See admission H&P Social History:  Social History   Substance and Sexual Activity  Alcohol Use Yes     Social History   Substance and Sexual Activity  Drug Use No     Social History   Socioeconomic History  . Marital status: Single    Spouse name: Not on file  . Number of children: Not on file  . Years of education: Not on file  . Highest education level: Not on file  Occupational History  . Not on file  Social Needs  . Financial resource strain: Not on file  . Food insecurity:    Worry: Not on file    Inability: Not on file  . Transportation needs:    Medical: Not on file    Non-medical: Not on file  Tobacco Use  . Smoking status: Light Tobacco Smoker  . Smokeless tobacco: Never Used  Substance and Sexual Activity  . Alcohol use: Yes  . Drug use: No  . Sexual activity: Yes    Birth control/protection: Condom  Lifestyle  . Physical activity:    Days per week: Not on file    Minutes per session: Not on file  . Stress: Not on file  Relationships  . Social connections:    Talks on phone: Not on file    Gets together: Not on file    Attends religious service: Not on file    Active member of club or organization: Not on file    Attends meetings of clubs or organizations: Not on file    Relationship status: Not on file  Other Topics Concern  . Not on file  Social History Narrative  . Not on file   Additional Social History:    Pain Medications: See MAR Prescriptions: See MAR Over the  Counter: See MAR History of alcohol / drug use?: Yes Longest period of sobriety (when/how long): Unable to quantify Negative Consequences of Use: Personal relationships, Financial Withdrawal Symptoms: Other (Comment)(none) Name of Substance 1: Alcohol 1 - Age of First Use: 21 1 - Amount (size/oz): 8 beers 1 - Frequency: occasional, binge drinks under stress 1 - Duration: ongoing 1 - Last Use / Amount: 09/21/17                  Sleep: Good  Appetite:  Fair  Current Medications: Current Facility-Administered Medications  Medication Dose Route Frequency Provider Last Rate Last Dose  . acetaminophen (TYLENOL) tablet 650 mg  650 mg  Oral Q6H PRN Money, Gerlene Burdockravis B, FNP   650 mg at 09/23/17 2013  . alum & mag hydroxide-simeth (MAALOX/MYLANTA) 200-200-20 MG/5ML suspension 30 mL  30 mL Oral Q4H PRN Money, Feliz Beamravis B, FNP   30 mL at 09/22/17 1432  . diphenhydrAMINE (BENADRYL) capsule 50 mg  50 mg Oral Q6H PRN Money, Gerlene Burdockravis B, FNP   50 mg at 09/23/17 2003   Or  . diphenhydrAMINE (BENADRYL) injection 50 mg  50 mg Intramuscular Q6H PRN Money, Feliz Beamravis B, FNP      . divalproex (DEPAKOTE ER) 24 hr tablet 750 mg  750 mg Oral QHS Antonieta Pertlary, Terra Aveni Lawson, MD      . haloperidol (HALDOL) tablet 5 mg  5 mg Oral Q6H PRN Money, Gerlene Burdockravis B, FNP   5 mg at 09/23/17 2003   Or  . haloperidol lactate (HALDOL) injection 5 mg  5 mg Intramuscular Q6H PRN Money, Gerlene Burdockravis B, FNP      . hydrOXYzine (ATARAX/VISTARIL) tablet 25 mg  25 mg Oral TID PRN Money, Gerlene Burdockravis B, FNP   25 mg at 09/22/17 2123  . LORazepam (ATIVAN) tablet 1 mg  1 mg Oral Q6H PRN Antonieta Pertlary, Particia Strahm Lawson, MD   1 mg at 09/23/17 2003   Or  . LORazepam (ATIVAN) injection 1 mg  1 mg Intramuscular Q6H PRN Antonieta Pertlary, Arjun Hard Lawson, MD      . magnesium hydroxide (MILK OF MAGNESIA) suspension 30 mL  30 mL Oral Daily PRN Money, Feliz Beamravis B, FNP      . prazosin (MINIPRESS) capsule 1 mg  1 mg Oral QHS Antonieta Pertlary, Nava Song Lawson, MD   1 mg at 09/23/17 2111  . risperiDONE (RISPERDAL) tablet 1 mg  1 mg Oral Once Antonieta Pertlary, Brandace Cargle Lawson, MD      . sertraline (ZOLOFT) tablet 25 mg  25 mg Oral Daily Antonieta Pertlary, Novis League Lawson, MD   25 mg at 09/24/17 0827  . traZODone (DESYREL) tablet 50 mg  50 mg Oral QHS PRN Money, Gerlene Burdockravis B, FNP   50 mg at 09/23/17 2154    Lab Results: No results found for this or any previous visit (from the past 48 hour(s)).  Blood Alcohol level:  Lab Results  Component Value Date   ETH <10 09/21/2017   ETH <10 06/04/2017    Metabolic Disorder Labs: Lab Results  Component Value Date   HGBA1C 4.9 06/05/2017   MPG 93.93 06/05/2017   No results found for: PROLACTIN Lab Results  Component Value Date   CHOL 163 06/05/2017    TRIG 69 06/05/2017   HDL 41 06/05/2017   CHOLHDL 4.0 06/05/2017   VLDL 14 06/05/2017   LDLCALC 108 (H) 06/05/2017    Physical Findings: AIMS: Facial and Oral Movements Muscles of Facial Expression: None, normal Lips and Perioral Area: None, normal Jaw: None, normal Tongue: None,  normal,Extremity Movements Upper (arms, wrists, hands, fingers): None, normal Lower (legs, knees, ankles, toes): None, normal, Trunk Movements Neck, shoulders, hips: None, normal, Overall Severity Severity of abnormal movements (highest score from questions above): None, normal Incapacitation due to abnormal movements: None, normal Patient's awareness of abnormal movements (rate only patient's report): No Awareness, Dental Status Current problems with teeth and/or dentures?: No Does patient usually wear dentures?: No  CIWA:  CIWA-Ar Total: 0 COWS:  COWS Total Score: 0  Musculoskeletal: Strength & Muscle Tone: within normal limits Gait & Station: normal Patient leans: N/A  Psychiatric Specialty Exam: Physical Exam  Nursing note and vitals reviewed. Constitutional: He is oriented to person, place, and time. He appears well-developed and well-nourished.  HENT:  Head: Normocephalic and atraumatic.  Respiratory: Effort normal.  Neurological: He is alert and oriented to person, place, and time.    ROS  Blood pressure 133/89, pulse 75, temperature 99.1 F (37.3 C), temperature source Oral, resp. rate 16, height 6\' 2"  (1.88 m), weight 105.2 kg (232 lb), SpO2 100 %.Body mass index is 29.79 kg/m.  General Appearance: Casual  Eye Contact:  Minimal  Speech:  Normal Rate  Volume:  Normal  Mood:  Anxious, Depressed and Irritable  Affect:  Constricted  Thought Process:  Coherent  Orientation:  Full (Time, Place, and Person)  Thought Content:  Logical  Suicidal Thoughts:  No  Homicidal Thoughts:  No  Memory:  Immediate;   Fair Recent;   Fair Remote;   Fair  Judgement:  Intact  Insight:  Fair   Psychomotor Activity:  Increased  Concentration:  Concentration: Fair and Attention Span: Fair  Recall:  [employer] of Knowledge:  Fair  Language:  Fair  Akathisia:  Negative  Handed:  Right  AIMS (if indicated):     Assets:  Desire for Improvement Housing Physical Health Resilience  ADL's:  Intact  Cognition:  WNL  Sleep:  Number of Hours: 6.75     Treatment Plan Summary: Daily contact with patient to assess and evaluate symptoms and progress in treatment, Medication management and Plan .  Patient is a 22 year old male with the above-stated past psychiatric history seen in follow-up.  His irritability and agitation is decreased from yesterday.  He is sedated this morning most likely from some of the medications yesterday, as well as Risperdal and Depakote last night.  I am going to stop the Risperdal, and increase his Depakote ER to 750 mg p.o. nightly.  No other changes to his medications, and hopefully this will add more mood stability.  Antonieta Pert, MD 09/24/2017, 11:30 AM

## 2017-09-24 NOTE — [facility] Group Notes (Signed)
[facility] LCSW Group Therapy Note  Date/Time: 09/24/17, 1315  Type of Therapy and Topic:  Group Therapy:  Overcoming Obstacles  Participation Level:  moderate  Description of Group:    In this group patients will be encouraged to explore what they see as obstacles to their own wellness and recovery. They will be guided to discuss their thoughts, feelings, and behaviors related to these obstacles. The group will process together ways to cope with barriers, with attention given to specific choices patients can make. Each patient will be challenged to identify changes they are motivated to make in order to overcome their obstacles. This group will be process-oriented, with patients participating in exploration of their own experiences as well as giving and receiving support and challenge from other group members.  Therapeutic Goals: 1. Patient will identify personal and current obstacles as they relate to admission. 2. Patient will identify barriers that currently interfere with their wellness or overcoming obstacles.  3. Patient will identify feelings, thought process and behaviors related to these barriers. 4. Patient will identify two changes they are willing to make to overcome these obstacles:    Summary of Patient Progress: Pt shared that loss of his relationship with his fiancee is his current obstacle.  Client mostly quiet during group discussion but did respond to CSW questions and share some at one point.  Pt had his head down at other points during the group discussion.      Therapeutic Modalities:   Cognitive Behavioral Therapy Solution Focused Therapy Motivational Interviewing Relapse Prevention Therapy  Daleen SquibbGreg Ladell Lea, LCSW

## 2017-09-24 NOTE — Plan of Care (Signed)
  Problem: Activity: Goal: Interest or engagement in activities will improve Outcome: Not Progressing   Problem: Safety: Goal: Periods of time without injury will increase Outcome: Progressing   Problem: Coping: Goal: Coping ability will improve Outcome: Not Progressing  DAR NOTE: Patient presents with anxious affect and mood.  Denies suicidal thoughts, pain, auditory and visual hallucinations.  Rates depression at 6, hopelessness at 5, and anxiety at 3.  Maintained on routine safety checks.  Medications given as prescribed.  Support and encouragement offered as needed.  Patient returned from the [facility] and asked for anxiety medication.  Patient observed lying on the floor at the end of the hallway interacting with a male peer and laughing.  States goal for today is "to be better."  Patient is safe on and off the unit.

## 2017-09-25 MED ORDER — SERTRALINE HCL 50 MG PO TABS
50.0000 mg | ORAL_TABLET | Freq: Every day | ORAL | Status: DC
  Administered 2017-09-26: 50 mg via ORAL
  Filled 2017-09-25 (×2): qty 1
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
## 2017-09-25 NOTE — Progress Notes (Deleted)
Adult Psychoeducational Group Note  Date:  09/25/2017 Time:  12:09 AM  Group Topic/Focus:  Wrap-Up Group:   The focus of this group is to help patients review their daily goal of treatment and discuss progress on daily workbooks.  Participation Level:  Active  Participation Quality:  Appropriate and Sharing  Affect:  Appropriate  Cognitive:  Appropriate  Insight: Appropriate  Engagement in Group:  Engaged  Modes of Intervention:  Discussion  Additional Comments:  Pt stated that she spoke with NP to adjust the timing of her meds so she is not sleeping so much throughout the day.  Pt expressed that she enjoyed having visitors today as it made her feel like she has support.   Ieesha Abbasi 09/25/2017, 12:09 AM

## 2017-09-25 NOTE — Progress Notes (Signed)
Adult Psychoeducational Group Note  Date:  09/25/2017 Time:  12:16 AM  Group Topic/Focus:  Wrap-Up Group:   The focus of this group is to help patients review their daily goal of treatment and discuss progress on daily workbooks.  Participation Level:  Active  Participation Quality:  Appropriate and Sharing  Affect:  Appropriate  Cognitive:  Appropriate  Insight: Appropriate  Engagement in Group:  Engaged  Modes of Intervention:  Discussion  Additional Comments:  Pt stated the he has finally accepted that he will need to remove himself from an ex girlfriend in order to see some changes in his life.  Pt is rated the day at an 8/10.  Dajia Gunnels 09/25/2017, 12:16 AM

## 2017-09-25 NOTE — Plan of Care (Signed)
  Problem: Activity: Goal: Interest or engagement in activities will improve Outcome: Progressing   Problem: Safety: Goal: Periods of time without injury will increase Outcome: Progressing   Problem: Education: Goal: Ability to state activities that reduce stress will improve Outcome: Progressing  DAR NOTE: Patient presents with anxious affect and mood.  Denies suicidal thoughts, pain, auditory and visual hallucinations.  Rates depression at 2, hopelessness at 3, and anxiety at 5.  Maintained on routine safety checks.  Medications given as prescribed.  Support and encouragement offered as needed.  Attended group and participated.  States goal for today is "not to call Kirt BoysMolly."  Patient observed socializing with peers in the dayroom.  Patient requested and received Vistaril 25 mg for complain of anxiety with good effect.

## 2017-09-25 NOTE — Progress Notes (Signed)
Recreation Therapy Notes  Animal-Assisted Activity (AAA) Program Checklist/Progress Notes Patient Eligibility Criteria Checklist & Daily Group note for Rec Tx Intervention  Date: 7.23.19 Time: 1430 Location: 300 Hall Group Room   AAA/T Program Assumption of Risk Form signed by Patient/ or Parent Legal Guardian YES   Patient is free of allergies or sever asthma YES   Patient reports no fear of animals YES  Patient reports no history of cruelty to animals YES   Patient understands his/her participation is voluntary YES   Patient washes hands before animal contact YES   Patient washes hands after animal contact YES   Behavioral Response: Engaged  Education: Charity fundraiserHand Washing, Appropriate Animal Interaction   Education Outcome: Acknowledges understanding/In group clarification offered/Needs additional education.   Clinical Observations/Feedback:  Pt attended and participated in activity.    Caroll RancherMarjette Tauriel Scronce, LRT/CTRS         Caroll RancherLindsay, Prerna Harold A 09/25/2017 3:43 PM

## 2017-09-25 NOTE — [facility] Group Notes (Signed)
LCSW Group Therapy Note   09/25/2017   1:30PM  Type of Therapy/Topic: Group Therapy: Feelings about Diagnosis  Participation Level: Active   Description of Group:  This group will allow patients to explore their thoughts and feelings about diagnoses they have received. Patients will be guided to explore their level of understanding and acceptance of these diagnoses. Facilitator will encourage patients to process their thoughts and feelings about the reactions of others to their diagnosis and will guide patients in identifying ways to discuss their diagnosis with significant others in their lives. This group will be process-oriented, with patients participating in exploration of their own experiences, giving and receiving support, and processing challenge from other group members.  Therapeutic Goals: 1. Patient will demonstrate understanding of diagnosis as evidenced by identifying two or more symptoms of the disorder 2. Patient will be able to express two feelings regarding the diagnosis 3. Patient will demonstrate their ability to communicate their needs through discussion and/or role play  Summary of Patient Progress: Patient actively participated in group discussion. He identified his feelings towards being diagnosed with PTSD and how he refused the diagnosis until something happened that caused him to black out. He identified two positive traits about himself that make him a unique individual.   Therapeutic Modalities:  Cognitive Behavioral Therapy Brief Therapy Feelings Identification    Roselyn Beringegina Breyon Blass, MSW, LCSW Clinical Social Worker Phone: [phone]

## 2017-09-26 LAB — VALPROIC ACID LEVEL: VALPROIC ACID LVL: 35 ug/mL — AB (ref 50.0–100.0)

## 2017-09-26 MED ORDER — DIVALPROEX SODIUM ER 500 MG PO TB24
1000.0000 mg | ORAL_TABLET | Freq: Every day | ORAL | Status: DC
  Administered 2017-09-26: 1000 mg via ORAL
  Filled 2017-09-26: qty 2
  Filled 2017-09-26: qty 14
  Filled 2017-09-26: qty 2
  Filled 2017-09-26: qty 14

## 2017-09-26 MED ORDER — SERTRALINE HCL 25 MG PO TABS
75.0000 mg | ORAL_TABLET | Freq: Every day | ORAL | Status: DC
  Administered 2017-09-27: 75 mg via ORAL
  Filled 2017-09-26 (×2): qty 21
  Filled 2017-09-26 (×2): qty 3

## 2017-09-26 NOTE — Plan of Care (Signed)
  Problem: Activity: Goal: Interest or engagement in activities will improve Outcome: Progressing   Problem: Coping: Goal: Ability to verbalize frustrations and anger appropriately will improve Outcome: Progressing   Problem: Safety: Goal: Periods of time without injury will increase Outcome: Progressing  DAR NOTE: Patient presents with jovial affect and pleasant mood.  Reports good sleep, good appetite, normal energy, and good concentration.Denies pain, auditory and visual hallucinations.  Rates depression at 0, hopelessness at 0, and anxiety at 3.  Maintained on routine safety checks.  Medications given as prescribed.  Support and encouragement offered as needed.  Attended group and participated.  States goal for today is "work on my discharge plan."  Patient observed socializing with peers in the dayroom.  Offered no complaint.

## 2017-09-26 NOTE — [facility] Suicide Risk Assessment (Addendum)
[facility] INPATIENT:  Family/Significant Other Suicide Prevention Education  Suicide Prevention Education:  Contact Attempts: Jack FarberMolly Holmes, ex-girlfriend, [phone], has been identified by the patient as the family member/significant other with whom the patient will be residing, and identified as the person(s) who will aid the patient in the event of a mental health crisis.  With written consent from the patient, two attempts were made to provide suicide prevention education, prior to and/or following the patient's discharge.  We were unsuccessful in providing suicide prevention education.  A suicide education pamphlet was given to the patient to share with family/significant other.  Date and time of first attempt:09/26/17, [id number] Date and time of second attempt:09/27/17 0836  Lorri FrederickWierda, Teryn Boerema Jon, LCSW 09/26/2017, 4:10 PM

## 2017-09-26 NOTE — Progress Notes (Signed)
Recreation Therapy Notes  Date: 7.24.19 Time: 0930 Location: 300 Hall Dayroom  Group Topic: Stress Management  Goal Area(s) Addresses:  Patient will verbalize importance of using healthy stress management.  Patient will identify positive emotions associated with healthy stress management.   Intervention: Stress Management  Activity : Meditation.  LRT introduced the stress management technique of meditation.  LRT played a meditation that focused on choices.  Patients were to follow along as meditation was read to fully engage in the activity.  Education:  Stress Management, Discharge Planning.   Education Outcome: Acknowledges edcuation/In group clarification offered/Needs additional education  Clinical Observations/Feedback: Pt did not attend group.    Caroll RancherMarjette Carlyne Keehan, LRT/CTRS         Lillia AbedLindsay, Madiline Saffran A 09/26/2017 11:02 AM

## 2017-09-26 NOTE — Therapy (Signed)
Occupational Therapy Group Note  Date:  09/26/2017 Time:  3:00 PM  Group Topic/Focus:  Stress Management  Participation Level:  Active  Participation Quality:  Appropriate  Affect:  Blunted  Cognitive:  Appropriate  Insight: Improving  Engagement in Group:  Engaged  Modes of Intervention:  Activity, Discussion, Education and Socialization  Additional Comments:    S: "I like to go fishing"  O: Stress management group completed to use as productive coping strategy, to help mitigate maladaptive coping to integrate in functional BADL/IADL. Stress management tool worksheet discussed to educate on unhealthy vs healthy coping skills to manage stress to improve community integration. Coping strategies taught include: relaxation based- deep breathing, counting to 10, taking a 1 minute vacation, acceptance, stress balls, relaxation audio/video, visual/mental imagery. Positive mental attitude- gratitude, acceptance, cognitive reframing, positive self talk, anger management. Coping skills bingo played with education given on variety of coping skills between bingo calls. Pts encouraged to share experience with various coping skills and share what has worked for them with others. Coloring and relaxation guide handouts given at the end of the session. ]  A: Pt presents to group with blunted affect, engaged and participatory throughout session. Stress management tools worksheet completed. Pt states he needs to learn to practice "letting go". He states that he would often hang onto things which causes fights in his previous relationship, but now states he has a hard time letting go when people are wrong and he knows they are wrong. Advised pt to understand he is only in control of himself and not others. Pt engaged in coping skills bingo with much enjoyment.  P: Pt provided with education on stress management activities to implement into daily routine. Handouts given to facilitate carryover when  reintegrating into community     [facility]iberty HospitalKaylee Aradhya Shellenbarger, [place], OTR/L  AvnetKaylee Shonn Farruggia 09/26/2017, 3:00 PM

## 2017-09-26 NOTE — [facility] Group Notes (Signed)
[facility] Group Notes:  (Nursing/MHT/Case Management/Adjunct)  Date:  09/26/2017  Time:  1:15 pm  Type of Therapy:  Psychoeducational Skills  Participation Level:  Did Not Attend  Participation Quality:    Affect:    Cognitive:    Insight:    Engagement in Group:    Modes of Intervention:    Summary of Progress/Problems:  Earline MayotteKnight, Inari Shin Shephard 09/26/2017, 2:38 PM

## 2017-09-26 NOTE — Progress Notes (Signed)
[facility] MD Progress Note  09/26/2017 12:36 PM Chima Astorino  MRN:  [id number] Subjective: Patient reports improvement, describes feeling better, minimizes depression at this time, denies suicidal ideations, denies medication side effects. Objective: I have discussed case with treatment team and have met with patient. 22 year old single male with a past psychiatric history  of epression, PTSD,  alcohol use disorder.  He was admitted under commitment due to suicidal attempt by hanging. At this time patient reports he is "feeling a lot better".  Denies suicidal ideations and is future oriented, states he plans to return home at discharge but is hoping to sign up in the [place], and if not accepted there, plans to consider other options such as other branches of the [employer] or public service.  States he is no longer upset or ruminative regarding recent break-up, and reports "I feel like I am over it".  Of note, denies any homicidal or violent ideations and specifically also denies any homicidal ideations towards his ex-girlfriend. Denies medication side effects at present.  Labs reviewed-valproic acid serum level subtherapeutic at 35.  Denies suicidal ideations.  Noted to be visible and active in milieu, interacting appropriately with peers.  No disruptive behaviors.   Principal Problem: Depression, suicidal ideations Diagnosis:   Patient Active Problem List   Diagnosis Date Noted  . 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 Alcohol dependence with other alcohol-induced disorder ([place]) [F10.288]   . Major depressive disorder, single episode with mixed features [F32.9] 06/05/2017  . Posttraumatic stress disorder [F43.10] 06/05/2017  . Alcohol abuse [F10.10] 06/04/2017   Total Time spent with patient: 20 minutes  Past Psychiatric History: See admission H&P  Past Medical History: History reviewed. No pertinent past medical history. History  reviewed. No pertinent surgical history. Family History: History reviewed. No pertinent family history. Family Psychiatric  History: See admission H&P Social History:  Social History   Substance and Sexual Activity  Alcohol Use Yes     Social History   Substance and Sexual Activity  Drug Use No    Social History   Socioeconomic History  . Marital status: Single    Spouse name: Not on file  . Number of children: Not on file  . Years of education: Not on file  . Highest education level: Not on file  Occupational History  . Not on file  Social Needs  . Financial resource strain: Not on file  . Food insecurity:    Worry: Not on file    Inability: Not on file  . Transportation needs:    Medical: Not on file    Non-medical: Not on file  Tobacco Use  . Smoking status: Light Tobacco Smoker  . Smokeless tobacco: Never Used  Substance and Sexual Activity  . Alcohol use: Yes  . Drug use: No  . Sexual activity: Yes    Birth control/protection: Condom  Lifestyle  . Physical activity:    Days per week: Not on file    Minutes per session: Not on file  . Stress: Not on file  Relationships  . Social connections:    Talks on phone: Not on file    Gets together: Not on file    Attends religious service: Not on file    Active member of club or organization: Not on file    Attends meetings of clubs or organizations: Not on file    Relationship status: Not on file  Other Topics Concern  . Not  on file  Social History Narrative  . Not on file   Additional Social History:    Pain Medications: See MAR Prescriptions: See MAR Over the Counter: See MAR History of alcohol / drug use?: Yes Longest period of sobriety (when/how long): Unable to quantify Negative Consequences of Use: Personal relationships, Financial Withdrawal Symptoms: Other (Comment)(none) Name of Substance 1: Alcohol 1 - Age of First Use: 21 1 - Amount (size/oz): 8 beers 1 - Frequency: occasional, binge drinks  under stress 1 - Duration: ongoing 1 - Last Use / Amount: 09/21/17  Sleep: Good  Appetite:  Good  Current Medications: Current Facility-Administered Medications  Medication Dose Route Frequency Provider Last Rate Last Dose  . acetaminophen (TYLENOL) tablet 650 mg  650 mg Oral Q6H PRN Money, Lowry Ram, FNP   650 mg at 09/25/17 1002  . alum & mag hydroxide-simeth (MAALOX/MYLANTA) 200-200-20 MG/5ML suspension 30 mL  30 mL Oral Q4H PRN Money, Darnelle Maffucci B, FNP   30 mL at 09/22/17 1432  . diphenhydrAMINE (BENADRYL) capsule 50 mg  50 mg Oral Q6H PRN Money, Lowry Ram, FNP   50 mg at 09/23/17 2003   Or  . diphenhydrAMINE (BENADRYL) injection 50 mg  50 mg Intramuscular Q6H PRN Money, Lowry Ram, FNP      . divalproex (DEPAKOTE ER) 24 hr tablet 1,000 mg  1,000 mg Oral QHS Marvie Brevik A, MD      . haloperidol (HALDOL) tablet 5 mg  5 mg Oral Q6H PRN Money, Lowry Ram, FNP   5 mg at 09/23/17 2003   Or  . haloperidol lactate (HALDOL) injection 5 mg  5 mg Intramuscular Q6H PRN Money, Lowry Ram, FNP      . hydrOXYzine (ATARAX/VISTARIL) tablet 25 mg  25 mg Oral TID PRN Money, Lowry Ram, FNP   25 mg at 09/26/17 0731  . LORazepam (ATIVAN) tablet 1 mg  1 mg Oral Q6H PRN Sharma Covert, MD   1 mg at 09/23/17 2003   Or  . LORazepam (ATIVAN) injection 1 mg  1 mg Intramuscular Q6H PRN Sharma Covert, MD      . magnesium hydroxide (MILK OF MAGNESIA) suspension 30 mL  30 mL Oral Daily PRN Money, Darnelle Maffucci B, FNP      . prazosin (MINIPRESS) capsule 1 mg  1 mg Oral QHS Sharma Covert, MD   1 mg at 09/25/17 2108  . risperiDONE (RISPERDAL) tablet 1 mg  1 mg Oral Once Sharma Covert, MD      . Derrill Memo ON 09/27/2017] sertraline (ZOLOFT) tablet 75 mg  75 mg Oral Daily Carlas Vandyne, Myer Peer, MD      . traZODone (DESYREL) tablet 50 mg  50 mg Oral QHS PRN Money, Lowry Ram, FNP   50 mg at 09/25/17 2110    Lab Results:  Results for orders placed or performed during the hospital encounter of 09/22/17 (from the past 48  hour(s))  Valproic acid level     Status: Abnormal   Collection Time: 09/26/17  6:20 AM  Result Value Ref Range   Valproic Acid Lvl 35 (L) 50.0 - 100.0 ug/mL    Comment: Performed at [facility], [place] [address], [place],  [address]    Blood Alcohol level:  Lab Results  Component Value Date   [facility] <10 09/21/2017   ETH <10 14/78/2956    Metabolic Disorder Labs: Lab Results  Component Value Date   HGBA1C 4.9 06/05/2017   MPG 93.93 06/05/2017  No results found for: PROLACTIN Lab Results  Component Value Date   CHOL 163 06/05/2017   TRIG 69 06/05/2017   HDL 41 06/05/2017   CHOLHDL 4.0 06/05/2017   VLDL 14 06/05/2017   LDLCALC 108 (H) 06/05/2017    Physical Findings: AIMS: Facial and Oral Movements Muscles of Facial Expression: None, normal Lips and Perioral Area: None, normal Jaw: None, normal Tongue: None, normal,Extremity Movements Upper (arms, wrists, hands, fingers): None, normal Lower (legs, knees, ankles, toes): None, normal, Trunk Movements Neck, shoulders, hips: None, normal, Overall Severity Severity of abnormal movements (highest score from questions above): None, normal Incapacitation due to abnormal movements: None, normal Patient's awareness of abnormal movements (rate only patient's report): No Awareness, Dental Status Current problems with teeth and/or dentures?: No Does patient usually wear dentures?: No  CIWA:  CIWA-Ar Total: 0 COWS:  COWS Total Score: 0  Musculoskeletal: Strength & Muscle Tone: within normal limits Gait & Station: normal Patient leans: N/A  Psychiatric Specialty Exam: Physical Exam  Nursing note and vitals reviewed. Constitutional: He is oriented to person, place, and time. He appears well-developed and well-nourished.  HENT:  Head: Normocephalic and atraumatic.  Respiratory: Effort normal.  Neurological: He is alert and oriented to person, place, and time.    ROS no nausea, no vomiting, no tremors   Blood pressure 120/83, pulse (!) 102, temperature 98.2 F (36.8 C), temperature source Oral, resp. rate 18, height 6' 2"  (1.88 m), weight 105.2 kg (232 lb), SpO2 100 %.Body mass index is 29.79 kg/m.  General Appearance: Improving grooming  Eye Contact:  Good  Speech:  Normal Rate  Volume:  Normal  Mood:  Describes improved mood and currently denies depression  Affect:  Reactive, smiles at times appropriately  Thought Process:  Linear and Descriptions of Associations: Intact  Orientation:  Full (Time, Place, and Person)  Thought Content:  no hallucinations, no delusions, not internally preoccupied   Suicidal Thoughts:  No denies suicidal or self injurious ideations at this time and denies any homicidal or violent ideations, also specifically denies any violent or homicidal ideations towards ex GF  Homicidal Thoughts:  No  Memory:  recent and remote grossly intact   Judgement:  Fair- improving   Insight:  Fair- improving  Psychomotor Activity:  Normal  Concentration:  Concentration: Good and Attention Span: Good  Recall:  Good  Fund of Knowledge:  Good  Language:  Good  Akathisia:  Negative  Handed:  Right  AIMS (if indicated):     Assets:  Desire for Improvement Housing Physical Health Resilience  ADL's:  Intact  Cognition:  WNL  Sleep:  Number of Hours: 6.75   Assessment -patient reports feeling better compared to admission, currently minimizes depression or significant neurovegetative symptoms of depression, denies any suicidal ideations and presents future oriented.  Thus far tolerating medications well.  Of note, Depakote serum level is subtherapeutic.  He is tolerating this medication well without side effects.  As he improves he has become more focused on discharge planning, reports she plans to return home, proceed with outpatient psychiatric care.   Treatment Plan Summary: Treatment Plan reviewed as below today 7/24 Encourage group and milieu participation to work on  coping skills and symptom reduction Increase Depakote ER to 1000 mgrs QHS for mood disorder Increase Zoloft to 75  mgrs QDAY for depression, anxiety, PTSD Continue Trazodone 50 mgrs QHS PRN for insomnia as needed  Continue  Minipress 98mr QHS for nightmares  Haldol/Ativan PRN for agitation if needed  Treatment Team working on disposition planning options   Jenne Campus, MD 09/26/2017, 12:36 PM   Patient ID: Suhas Estis, male   DOB: 08-May-1995, 22 y.o.   MRN: [id number]

## 2017-09-26 NOTE — Progress Notes (Signed)
D: Pt was in dayroom upon initial approach.  Pt presents with appropriate affect and mood.  He is seen smiling and laughing at times.  Describes his day as "super" and reports he "failed" to meet his goal, which was "not to talk to my ex."  He reports he called his ex-girlfriend "just so I knew when to be picked up" on discharge.  Pt denies SI/HI, denies hallucinations, denies pain.  Pt has been visible in milieu interacting with peers and staff appropriately.  Pt attended evening group.    A: Introduced self to pt.  Actively listened to pt and offered support and encouragement. Medications administered per order.  PRN medication administered for sleep.  Q15 minute safety checks maintained.  R: Pt is safe on the unit.  Pt is compliant with medications.  Pt verbally contracts for safety.  Will continue to monitor and assess.

## 2017-09-26 NOTE — Progress Notes (Signed)
Adult Psychoeducational Group Note  Date:  09/26/2017 Time:  10:10 PM  Group Topic/Focus:  Wrap-Up Group:   The focus of this group is to help patients review their daily goal of treatment and discuss progress on daily workbooks.  Participation Level:  Active  Participation Quality:  Appropriate  Affect:  Appropriate  Cognitive:  Alert  Insight: Appropriate  Engagement in Group:  Engaged  Modes of Intervention:  Discussion  Additional Comments:  Patient stated having a great day. Patient's goal for today was to develop a discharge plan with the doctor.   Ersel Wadleigh L Armarion Greek 09/26/2017, 10:10 PM

## 2017-09-26 NOTE — [facility] Group Notes (Signed)
[facility] 09/26/2017 1:15pm  Type of Therapy: Mental Health Association Presentation  Participation Level: Active  Participation Quality: Attentive  Affect: Appropriate  Cognitive: Oriented  Insight: Developing/Improving  Engagement in Therapy: Engaged  Modes of Intervention: Discussion, Education and Socialization  Summary of Progress/Problems: Mental Health Association (MHA) Speaker came to talk about his personal journey with mental health. The pt processed ways by which to relate to the speaker. MHA speaker provided handouts and educational information pertaining to groups and services offered by the [facility]. Pt was engaged in speaker's presentation and was receptive to resources provided.    Lorri FrederickWierda, Jakota Manthei Jon, LCSW 09/26/2017 12:26 PM

## 2017-09-26 NOTE — Progress Notes (Signed)
Pt was observed in the dayroom, attending wrap-up group. Pt appears animated/anxious in affect and mood. Pt denies SI/HI/AVH/Pain at this time. Pt states he hopes to join the [employer]. Pt states chaplin came today to offer support. Pt interacts well with other peers on the unit. Encouragement and support offered. PRN vistaril and trazodone requested and given. Will continue with POC.

## 2017-09-26 NOTE — Progress Notes (Signed)
Chaplain delivered rosary for prayers.  Rosary was given to charge nurse and Jack Holmes can check out from nursing when he is going to use it.   Jack Holmes had requested rosary during spirituality group stating his grandfather was devout catholic and would be upset if he knew Jack Holmes did not have rosary.

## 2017-09-27 ENCOUNTER — Encounter (HOSPITAL_COMMUNITY): Payer: Self-pay | Admitting: Behavioral Health

## 2017-09-27 MED ORDER — DIVALPROEX SODIUM ER 500 MG PO TB24: 1000.0000 mg | ORAL_TABLET | Freq: Every day | ORAL | 0 refills | Status: AC

## 2017-09-27 MED ORDER — RISPERIDONE 1 MG PO TABS: 1.0000 mg | ORAL_TABLET | Freq: Once | ORAL | 0 refills | Status: DC

## 2017-09-27 MED ORDER — TRAZODONE HCL 50 MG PO TABS: 50.0000 mg | ORAL_TABLET | Freq: Every evening | ORAL | 0 refills | Status: AC | PRN

## 2017-09-27 MED ORDER — HYDROXYZINE HCL 25 MG PO TABS: 25.0000 mg | ORAL_TABLET | Freq: Three times a day (TID) | ORAL | 0 refills | Status: AC | PRN

## 2017-09-27 MED ORDER — PRAZOSIN HCL 1 MG PO CAPS: 1.0000 mg | ORAL_CAPSULE | Freq: Every day | ORAL | 0 refills | Status: AC

## 2017-09-27 MED ORDER — SERTRALINE HCL 25 MG PO TABS: 75.0000 mg | ORAL_TABLET | Freq: Every day | ORAL | 0 refills | Status: AC

## 2017-09-27 NOTE — Progress Notes (Signed)
Pt attended goals and orientation group this morning. Pt participated and was active.   

## 2017-09-27 NOTE — Discharge Summary (Addendum)
Physician Discharge Summary Note  Patient:  Jack Holmes is an 22 y.o., male MRN:  [id number] DOB:  April 19, 1995 Patient phone:  [phone] (home)  Patient address:   [address] [place] [address],  Total Time spent with patient: 30 minutes  Date of Admission:  09/22/2017 Date of Discharge: 09/27/2017  Reason for Admission:  Suicidal ideation; threats to killself  Principal Problem: MDD (major depressive disorder), recurrent severe, without psychosis [facility]) Discharge Diagnoses: Patient Active Problem List   Diagnosis Date Noted  . MDD (major depressive disorder), recurrent severe, without psychosis (HCC) [F33.2] 09/22/2017  . Alcohol dependence with other alcohol-induced disorder (HCC) [F10.288]   . Major depressive disorder, single episode with mixed features [F32.9] 06/05/2017  . Posttraumatic stress disorder [F43.10] 06/05/2017  . Alcohol abuse [F10.10] 06/04/2017    Past Psychiatric History: Patient had his last psychiatric hospitalization at [facility].  He was discharged on Seroquel and prazosin.  He also had been in substance abuse rehabilitation x1 for cocaine which she has not used in quite a while.  He also was diagnosed with attention deficit disorder and received stimulants as an adolescent and child.    Past Medical History: History reviewed. No pertinent past medical history. History reviewed. No pertinent surgical history. Family History: History reviewed. No pertinent family history. Family Psychiatric  History: Alcoholism and depression   Social History:  Social History   Substance and Sexual Activity  Alcohol Use Yes     Social History   Substance and Sexual Activity  Drug Use No    Social History   Socioeconomic History  . Marital status: Single    Spouse name: Not on file  . Number of children: Not on file  . Years of education: Not on file  . Highest education level: Not on file  Occupational History  . Not on file   Social Needs  . Financial resource strain: Not on file  . Food insecurity:    Worry: Not on file    Inability: Not on file  . Transportation needs:    Medical: Not on file    Non-medical: Not on file  Tobacco Use  . Smoking status: Light Tobacco Smoker  . Smokeless tobacco: Never Used  Substance and Sexual Activity  . Alcohol use: Yes  . Drug use: No  . Sexual activity: Yes    Birth control/protection: Condom  Lifestyle  . Physical activity:    Days per week: Not on file    Minutes per session: Not on file  . Stress: Not on file  Relationships  . Social connections:    Talks on phone: Not on file    Gets together: Not on file    Attends religious service: Not on file    Active member of club or organization: Not on file    Attends meetings of clubs or organizations: Not on file    Relationship status: Not on file  Other Topics Concern  . Not on file  Social History Narrative  . Not on file    Hospital Course:  Patient is seen and examined.  Patient is a 22 year old male with a past psychiatric history significant for major depression, posttraumatic stress disorder as well as attention deficit disorder who was placed under involuntary commitment by [facility] police.  According to the involuntary commitment paperwork "people on [facility] saw the respondent say he was going to kill himself.  They stated Jack Holmes said he was unsuccessful the night prior to  admission and was going to attempt suicide again today".  Patient stated that he had become significantly depressed after his girlfriend broke up with him.  She then moved in with another guy.  Patient stated after relationship ended he decided to hang himself with a belt.  Patient stated the belt broke, but he lost consciousness for about an hour.  When asked about weapons in the emergency department the patient stated "while this is [place] so of course I can get a gun".  Patient also admitted to excessive alcohol for quite  a while.  He stated that the day prior to admission he drank 9 beers.  Surprisingly his blood alcohol was negative, and his liver function enzymes were normal.  His last psychiatric hospitalization was at [facility] approximately 1 year ago.  He also had a previous admission for cocaine, and was in rehab and got finished with that.  He was diagnosed with depression at the [facility].  He had been discharged on prazosin and Seroquel.  He stated he thought he had insurance at the time, but ended up not having it and was unable to afford his medications.  He was also referred to [facility] in [place], but when he went there for follow-up he was unable to be seen because he did not have insurance.  He most recently has gone to [facility] and has participated in some group therapy, but has been inconsistent with that.  He was admitted to the hospital for evaluation and stabilization.  Jack Holmes was started on medication regimen for presenting symptoms. He was medicated & discharged on;  Depakote ER to 1000 mgrs QHS for mood disorder Zoloft to 75  mgrs QDAY for depression, anxiety, PTSD Trazodone 50 mgrs QHS PRN for insomnia as needed  Minipress 1mg r QHS for nightmares   Patient has been adherent with treatment recommendations. Patient tolerated the medications without any reported side effects are adverse reactions.  Patient was enrolled & participated in the group counseling sessions being offerred & held on this unit. Patient learned coping skills.  Labs: Depakote serum level is subtherapeutic 35. CBC and TSH normal. CMP sodium 146 ad glucose 109 otherwise normal. UDS negative/   Jack Holmes is seen today by the attending psychiatrist for discharge. Patient denies any delusions, no hallucinations or other psychotic process. Patient denies active or passive suicidal thoughts. No thoughts of violence. No craving for drugs. Endorses overall improvement in mood emotional state.     Nursing staff reports that patient has been appropriate on the unit. Patient has been interacting well with peers. No behavioral issues. Patient has not voiced any suicidal thoughts prior to discharge. Patient was discussed at the treatment team meeting this morning. Team members feels that patient is back to his baseline level of functioning. Team agrees with plan to discharge patient today. Patient was provided with all follow-up information to resume mental health treatment following discharge as noted below. Dekari was provided with a prescription for his [facility] discharge medications.  Patient left [facility] with all personal belongings in no apparent distress. Transportation per patient/ family arrangement.    Physical Findings: AIMS: Facial and Oral Movements Muscles of Facial Expression: None, normal Lips and Perioral Area: None, normal Jaw: None, normal Tongue: None, normal,Extremity Movements Upper (arms, wrists, hands, fingers): None, normal Lower (legs, knees, ankles, toes): None, normal, Trunk Movements Neck, shoulders, hips: None, normal, Overall Severity Severity of abnormal movements (highest score from questions above): None, normal Incapacitation due to abnormal movements:  None, normal Patient's awareness of abnormal movements (rate only patient's report): No Awareness, Dental Status Current problems with teeth and/or dentures?: No Does patient usually wear dentures?: No  CIWA:  CIWA-Ar Total: 0 COWS:  COWS Total Score: 0  Musculoskeletal: Strength & Muscle Tone: within normal limits Gait & Station: normal Patient leans: N/A  Psychiatric Specialty Exam: SEE SRA BY MD  Physical Exam  Nursing note and vitals reviewed. Constitutional: He is oriented to person, place, and time.  Neurological: He is alert and oriented to person, place, and time.    Review of Systems  Psychiatric/Behavioral: Negative for hallucinations, memory loss and suicidal ideas. Depression: improved.  Substance abuse: hx of substance abuse  Nervous/anxious: improved. Insomnia: improved.   All other systems reviewed and are negative.   Blood pressure 118/80, pulse 98, temperature 98.2 F (36.8 C), temperature source Oral, resp. rate 16, height 6\' 2"  (1.88 m), weight 105.2 kg (232 lb), SpO2 100 %.Body mass index is 29.79 kg/m.    Have you used any form of tobacco in the last 30 days? (Cigarettes, Smokeless Tobacco, Cigars, and/or Pipes): No  Has this patient used any form of tobacco in the last 30 days? (Cigarettes, Smokeless Tobacco, Cigars, and/or Pipes)  N/A  Blood Alcohol level:  Lab Results  Component Value Date   ETH <10 09/21/2017   ETH <10 06/04/2017    Metabolic Disorder Labs:  Lab Results  Component Value Date   HGBA1C 4.9 06/05/2017   MPG 93.93 06/05/2017   No results found for: PROLACTIN Lab Results  Component Value Date   CHOL 163 06/05/2017   TRIG 69 06/05/2017   HDL 41 06/05/2017   CHOLHDL 4.0 06/05/2017   VLDL 14 06/05/2017   LDLCALC 108 (H) 06/05/2017    See Psychiatric Specialty Exam and Suicide Risk Assessment completed by Attending Physician prior to discharge.  Discharge destination:  Home  Is patient on multiple antipsychotic therapies at discharge:  No   Has Patient had three or more failed trials of antipsychotic monotherapy by history:  No  Recommended Plan for Multiple Antipsychotic Therapies: NA   Allergies as of 09/27/2017   No Known Allergies     Medication List    STOP taking these medications   acetaminophen 500 MG tablet Commonly known as:  TYLENOL   QUEtiapine 200 MG tablet Commonly known as:  SEROQUEL     TAKE these medications     Indication  divalproex 500 MG 24 hr tablet Commonly known as:  DEPAKOTE ER Take 2 tablets (1,000 mg total) by mouth at bedtime.  Indication:  mood stabilization   hydrOXYzine 25 MG tablet Commonly known as:  ATARAX/VISTARIL Take 1 tablet (25 mg total) by mouth 3 (three) times daily as  needed (mild/moderate anxiety).  Indication:  Feeling Anxious   prazosin 1 MG capsule Commonly known as:  MINIPRESS Take 1 capsule (1 mg total) by mouth at bedtime. What changed:    medication strength  how much to take  Indication:  nightmares   risperiDONE 1 MG tablet Commonly known as:  RISPERDAL Take 1 tablet (1 mg total) by mouth once for 1 dose.  Indication:  mood stabilization   sertraline 25 MG tablet Commonly known as:  ZOLOFT Take 3 tablets (75 mg total) by mouth daily. Start taking on:  09/28/2017  Indication:  Major Depressive Disorder   traZODone 50 MG tablet Commonly known as:  DESYREL Take 1 tablet (50 mg total) by mouth at bedtime as needed for sleep.  Indication:  Trouble Sleeping      Follow-up Information    [facility] Go on 10/01/2017.   Why:  Please attend your intake appt on Monday, 10/01/17, at 12:30pm.  Please bring your hospital discharge paperwork. Contact information: [address] [place] [address] [phone]           Follow-up recommendations:  Follow up with your outpatient provided for any medical issues. Activity & diet as recommended by your primary care provider.  Comments:  Patient is instructed prior to discharge to: Take all medications as prescribed by his/her mental healthcare provider. Report any adverse effects and or reactions from the medicines to his/her outpatient provider promptly. Patient has been instructed & cautioned: To not engage in alcohol and or illegal drug use while on prescription medicines. In the event of worsening symptoms, patient is instructed to call the crisis hotline, 911 and or go to the nearest ED for appropriate evaluation and treatment of symptoms. To follow-up with his/her primary care provider for your other medical issues, concerns and or health care needs.  Signed: Denzil MagnusonLaShunda Thomas, NP 09/27/2017, 9:48 AM   Patient seen, Suicide Assessment Completed.  Disposition  Plan Reviewed

## 2017-09-27 NOTE — Progress Notes (Signed)
Patient discharged to lobby. Patient was stable and appreciative at that time. All papers, samples and prescriptions were given and valuables returned. Verbal understanding expressed. Denies SI/HI and A/VH. Patient given opportunity to express concerns and ask questions.  

## 2017-09-27 NOTE — Progress Notes (Signed)
  [facility] :  Will you be returning to the same living situation after discharge:  Yes,  with ex girlfriend At discharge, do you have transportation home?: Yes,  with ex girlfriend Do you have the ability to pay for your medications: No. Will work with [facility].  Release of information consent forms completed and in the chart;  Patient's signature needed at discharge.  Patient to Follow up at: Follow-up Information    [facility] Go on 10/01/2017.   Why:  Please attend your intake appt on Monday, 10/01/17, at 12:30pm.  Please bring your hospital discharge paperwork. Contact information: [address] [place] [address] [phone]           Next level of care provider [facility] Link:no  Safety Planning and Suicide Prevention discussed: No. Attempts made.  Have you used any form of tobacco in the last 30 days? (Cigarettes, Smokeless Tobacco, Cigars, and/or Pipes): No  Has patient been referred to the Quitline?: N/A patient is not a smoker  Patient has been referred for addiction treatment: Yes  Lorri FrederickWierda, Adonai Helzer Jon, LCSW 09/27/2017, 10:59 AM

## 2017-09-27 NOTE — [facility] Suicide Risk Assessment (Addendum)
[facility]   Principal Problem: MDD (major depressive disorder), recurrent severe, without psychosis (HCC) Discharge Diagnoses:  Patient Active Problem List   Diagnosis Date Noted  . MDD (major depressive disorder), recurrent severe, without psychosis (HCC) [F33.2] 09/22/2017  . Alcohol dependence with other alcohol-induced disorder (HCC) [F10.288]   . Major depressive disorder, single episode with mixed features [F32.9] 06/05/2017  . Posttraumatic stress disorder [F43.10] 06/05/2017  . Alcohol abuse [F10.10] 06/04/2017    Total Time spent with patient: 30 minutes  Musculoskeletal: Strength & Muscle Tone: within normal limits Gait & Station: normal Patient leans: N/A  Psychiatric Specialty Exam: ROS denies headache, no chest pain, no shortness of breath, no vomiting, no fever, no rash   Blood pressure 118/80, pulse 98, temperature 98.2 F (36.8 C), temperature source Oral, resp. rate 16, height 6\' 2"  (1.88 m), weight 105.2 kg (232 lb), SpO2 100 %.Body mass index is 29.79 kg/m.  General Appearance: Well Groomed  Eye Contact::  Good  Speech:  Normal Rate409  Volume:  Normal  Mood:  improved, states he is feeling " a lot better"  Affect:  Appropriate and reactive, brighter   Thought Process:  Linear and Descriptions of Associations: Intact  Orientation:  Full (Time, Place, and Person)  Thought Content:  denies hallucinations, no delusions, not internally preoccupied   Suicidal Thoughts:  No denies suicidal or self injurious ideations, denies homicidal or violent ideations, specifically also denies any HI or violent ideations towards ex GF  Homicidal Thoughts:  No  Memory:  recent and remote grossly intact   Judgement:  Other:  improving   Insight:  improving   Psychomotor Activity:  Normal  Concentration:  Good  Recall:  Good  Fund of Knowledge:Good  Language: Good  Akathisia:  Negative  Handed:  Right  AIMS (if indicated):     Assets:  Desire  for Improvement Resilience  Sleep:  Number of Hours: 6.75  Cognition: WNL  ADL's:  Intact   Mental Status Per Nursing Assessment::   On Admission:  NA(Denies SI at this time)  Demographic Factors:  22 year old single male   Loss Factors: Recent break up  Historical Factors: Reports history of depression and of PTSD related to witnessing fatal accident while serving in the [employer] History of cocaine use disorder, currently in remission  Risk Reduction Factors:   Positive coping skills or problem solving skills  Continued Clinical Symptoms:  Patient presents alert,attentive,presents euthymic, with a full range of affect, no thought disorder, no suicidal or self injurious ideations, no homicidal or violent ideations and denies any HI towards ex GF. No hallucinations, no delusions, future oriented, states he is planning to join the [employer] and if not accepted is considering the [employer] . Denies medication side effects- side effects reviewed, to include potential risk of increased suicidal ideations early in treatment with antidepressant medications in young adults, and risk of sexual side effects, and potential serious side effects of Depakote. We reviewed importance of regular monitoring of medications with an outpatient psychiatrist and the need for periodic blood draws to monitor Depakote appropriately.  Behavior on unit in good control, pleasant on approach  Cognitive Features That Contribute To Risk:  No gross cognitive deficits noted upon discharge. Is alert , attentive, and oriented x 3   Suicide Risk:  Mild:  Suicidal ideation of limited frequency, intensity, duration, and specificity.  There are no identifiable plans, no associated intent, mild dysphoria and related symptoms, good self-control (both  objective and subjective assessment), few other risk factors, and identifiable protective factors, including available and accessible social support.  Follow-up Information     [facility] Go on 10/01/2017.   Why:  Please attend your intake appt on Monday, 10/01/17, at 12:30pm.  Please bring your hospital discharge paperwork. Contact information: [address] [place] [address] [phone]           Plan Of Care/Follow-up recommendations:  Activity:  as tolerated Diet:  regular Tests:  NA Other:  See below  Patient is expressing readiness for discharge and is leaving unit in good spirits  Plans to return home Follow up as above   Craige Cotta, MD 09/27/2017, 10:47 AM

## 2017-10-28 ENCOUNTER — Encounter: Payer: Self-pay | Admitting: Emergency Medicine

## 2017-10-28 ENCOUNTER — Emergency Department
Admission: EM | Admit: 2017-10-28 | Discharge: 2017-10-29 | Disposition: A | Discharge status: Home / Self Care | Payer: Self-pay | Attending: Student in an Organized Health Care Education/Training Program | Admitting: Student in an Organized Health Care Education/Training Program

## 2017-10-28 ENCOUNTER — Other Ambulatory Visit: Payer: Self-pay

## 2017-10-28 DIAGNOSIS — F1994 Other psychoactive substance use, unspecified with psychoactive substance-induced mood disorder: Secondary | ICD-10-CM

## 2017-10-28 DIAGNOSIS — F4310 Post-traumatic stress disorder, unspecified: Secondary | ICD-10-CM | POA: Diagnosis present

## 2017-10-28 DIAGNOSIS — R45851 Suicidal ideations: Secondary | ICD-10-CM | POA: Insufficient documentation

## 2017-10-28 DIAGNOSIS — Z87891 Personal history of nicotine dependence: Secondary | ICD-10-CM | POA: Insufficient documentation

## 2017-10-28 DIAGNOSIS — F1010 Alcohol abuse, uncomplicated: Secondary | ICD-10-CM | POA: Diagnosis present

## 2017-10-28 DIAGNOSIS — F329 Major depressive disorder, single episode, unspecified: Secondary | ICD-10-CM | POA: Insufficient documentation

## 2017-10-28 DIAGNOSIS — F332 Major depressive disorder, recurrent severe without psychotic features: Secondary | ICD-10-CM | POA: Diagnosis present

## 2017-10-28 DIAGNOSIS — Z79899 Other long term (current) drug therapy: Secondary | ICD-10-CM | POA: Insufficient documentation

## 2017-10-28 LAB — CBC
HCT: 49.1 % (ref 40.0–52.0)
Hemoglobin: 16.7 g/dL (ref 13.0–18.0)
MCH: 30.4 pg (ref 26.0–34.0)
MCHC: 34.1 g/dL (ref 32.0–36.0)
MCV: 89.2 fL (ref 80.0–100.0)
PLATELETS: 213 10*3/uL (ref 150–440)
RBC: 5.5 MIL/uL (ref 4.40–5.90)
RDW: 13.4 % (ref 11.5–14.5)
WBC: 5.6 10*3/uL (ref 3.8–10.6)

## 2017-10-28 LAB — COMPREHENSIVE METABOLIC PANEL
ALT: 24 U/L (ref 0–44)
AST: 23 U/L (ref 15–41)
Albumin: 4.8 g/dL (ref 3.5–5.0)
Alkaline Phosphatase: 96 U/L (ref 38–126)
Anion gap: 6 (ref 5–15)
BUN: 8 mg/dL (ref 6–20)
CHLORIDE: 106 mmol/L (ref 98–111)
CO2: 28 mmol/L (ref 22–32)
CREATININE: 1.05 mg/dL (ref 0.61–1.24)
Calcium: 9.4 mg/dL (ref 8.9–10.3)
GFR calc Af Amer: >60 mL/min (ref >60)
Glucose, Bld: 111 mg/dL — ABNORMAL HIGH (ref 70–99)
Potassium: 4.1 mmol/L (ref 3.5–5.1)
SODIUM: 140 mmol/L (ref 135–145)
Total Bilirubin: 0.6 mg/dL (ref 0.3–1.2)
Total Protein: 7.6 g/dL (ref 6.5–8.1)

## 2017-10-28 LAB — URINE DRUG SCREEN, QUALITATIVE (ARMC ONLY)
AMPHETAMINES, UR SCREEN: NOT DETECTED
Barbiturates, Ur Screen: NOT DETECTED
COCAINE METABOLITE, UR ~~LOC~~: NOT DETECTED
Cannabinoid 50 Ng, Ur ~~LOC~~: POSITIVE — AB
MDMA (ECSTASY) UR SCREEN: NOT DETECTED
Methadone Scn, Ur: NOT DETECTED
Opiate, Ur Screen: NOT DETECTED
Phencyclidine (PCP) Ur S: NOT DETECTED
TRICYCLIC, UR SCREEN: NOT DETECTED

## 2017-10-28 LAB — SALICYLATE LEVEL

## 2017-10-28 LAB — ETHANOL

## 2017-10-28 LAB — ACETAMINOPHEN LEVEL: Acetaminophen (Tylenol), Serum: <10 ug/mL — ABNORMAL LOW (ref 10–30)

## 2017-10-28 NOTE — ED Provider Notes (Addendum)
[facility] Emergency Department Provider Note    First MD Initiated Contact with Patient 10/28/17 1714     (approximate)  I have reviewed the triage vital signs and the nursing notes.   HISTORY  Chief Complaint SI   HPI Jack Holmes is a 22 y.o. male brought in to the ER via [facility] police department under IVC for SI.  Patient recently broke up with his girlfriend and while intoxicated has been telling his friends that he wished she were dead or that things would be better off if you were gone.  Apparently also sent a text a friend stating that he wanted to make sure that "his blood was on her hands ".  Denies any SI right now.  Has had previous admissions to psychiatric facility for previous episode of SI and depression.  Denies any changes to any medications.  States that things are worse when he is drinking.  Denies any alcohol ingestion today.    History reviewed. No pertinent past medical history. History reviewed. No pertinent family history. History reviewed. No pertinent surgical history. Patient Active Problem List   Diagnosis Date Noted  . MDD (major depressive disorder), recurrent severe, without psychosis (HCC) 09/22/2017  . 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 Major depressive disorder, single episode with mixed features 06/05/2017  . Posttraumatic stress disorder 06/05/2017  . Alcohol abuse 06/04/2017      Prior to Admission medications   Medication Sig Start Date End Date Taking? Authorizing Provider  divalproex (DEPAKOTE ER) 500 MG 24 hr tablet Take 2 tablets (1,000 mg total) by mouth at bedtime. 09/27/17  Yes Denzil Magnuson, NP  hydrOXYzine (ATARAX/VISTARIL) 25 MG tablet Take 1 tablet (25 mg total) by mouth 3 (three) times daily as needed (mild/moderate anxiety). 09/27/17  Yes Denzil Magnuson, NP  prazosin (MINIPRESS) 1 MG capsule Take 1 capsule (1 mg total) by mouth at bedtime. 09/27/17  Yes Denzil Magnuson, NP  sertraline (ZOLOFT) 25 MG tablet Take 3 tablets (75 mg total) by mouth daily. 09/28/17  Yes Denzil Magnuson, NP  traZODone (DESYREL) 50 MG tablet Take 1 tablet (50 mg total) by mouth at bedtime as needed for sleep. 09/27/17  Yes Denzil Magnuson, NP    Allergies Patient has no known allergies.    Social History Social History   Tobacco Use  . Smoking status: Former Games developer  . Smokeless tobacco: Never Used  Substance Use Topics  . Alcohol use: Yes    Comment: pt states "a lot" but not daily  . Drug use: No    Review of Systems Patient denies headaches, rhinorrhea, blurry vision, numbness, shortness of breath, chest pain, edema, cough, abdominal pain, nausea, vomiting, diarrhea, dysuria, fevers, rashes or hallucinations unless otherwise stated above in HPI. ____________________________________________   PHYSICAL EXAM:  VITAL SIGNS: Vitals:   10/28/17 1636 10/28/17 2126  BP: 128/78 116/77  Pulse: (!) 104 69  Resp: 18 18  Temp: 98.6 F (37 C) 98 F (36.7 C)  SpO2: 96% 100%    Constitutional: Alert and oriented.  Eyes: Conjunctivae are normal.  Head: Atraumatic. Nose: No congestion/rhinnorhea. Mouth/Throat: Mucous membranes are moist.   Neck: No stridor. Painless ROM.  Cardiovascular: Normal rate, regular rhythm. Grossly normal heart sounds.  Good peripheral circulation. Respiratory: Normal respiratory effort.  No retractions. Lungs CTAB. Gastrointestinal: Soft and nontender. No distention. No abdominal bruits. No CVA tenderness. Genitourinary:  Musculoskeletal: No lower extremity tenderness nor edema.  No joint effusions. Neurologic:  Normal speech and language. No gross focal neurologic deficits are appreciated. No facial droop Skin:  Skin is warm, dry and intact. No rash noted. Psychiatric: Mood and affect are normal. Speech and behavior are normal.  ____________________________________________   LABS (all labs ordered are listed, but only abnormal  results are displayed)  Results for orders placed or performed during the hospital encounter of 10/28/17 (from the past 24 hour(s))  Comprehensive metabolic panel     Status: Abnormal   Collection Time: 10/28/17  4:43 PM  Result Value Ref Range   Sodium 140 135 - 145 mmol/L   Potassium 4.1 3.5 - 5.1 mmol/L   Chloride 106 98 - 111 mmol/L   CO2 28 22 - 32 mmol/L   Glucose, Bld 111 (H) 70 - 99 mg/dL   BUN 8 6 - 20 mg/dL   Creatinine, Ser 4.09 0.61 - 1.24 mg/dL   Calcium 9.4 8.9 - 81.1 mg/dL   Total Protein 7.6 6.5 - 8.1 g/dL   Albumin 4.8 3.5 - 5.0 g/dL   AST 23 15 - 41 U/L   ALT 24 0 - 44 U/L   Alkaline Phosphatase 96 38 - 126 U/L   Total Bilirubin 0.6 0.3 - 1.2 mg/dL   GFR calc non Af Amer >60 >60 mL/min   GFR calc Af Amer >60 >60 mL/min   Anion gap 6 5 - 15  Ethanol     Status: None   Collection Time: 10/28/17  4:43 PM  Result Value Ref Range   Alcohol, Ethyl (B) <10 <10 mg/dL  Salicylate level     Status: None   Collection Time: 10/28/17  4:43 PM  Result Value Ref Range   Salicylate Lvl <7.0 2.8 - 30.0 mg/dL  Acetaminophen level     Status: Abnormal   Collection Time: 10/28/17  4:43 PM  Result Value Ref Range   Acetaminophen (Tylenol), Serum <10 (L) 10 - 30 ug/mL  cbc     Status: None   Collection Time: 10/28/17  4:43 PM  Result Value Ref Range   WBC 5.6 3.8 - 10.6 K/uL   RBC 5.50 4.40 - 5.90 MIL/uL   Hemoglobin 16.7 13.0 - 18.0 g/dL   HCT 91.4 78.2 - 95.6 %   MCV 89.2 80.0 - 100.0 fL   MCH 30.4 26.0 - 34.0 pg   MCHC 34.1 32.0 - 36.0 g/dL   RDW 21.3 08.6 - 57.8 %   Platelets 213 150 - 440 K/uL  Urine Drug Screen, Qualitative     Status: Abnormal   Collection Time: 10/28/17  4:45 PM  Result Value Ref Range   Tricyclic, Ur Screen NONE DETECTED NONE DETECTED   Amphetamines, Ur Screen NONE DETECTED NONE DETECTED   MDMA (Ecstasy)Ur Screen NONE DETECTED NONE DETECTED   Cocaine Metabolite,Ur [place] NONE DETECTED NONE DETECTED   Opiate, Ur Screen NONE DETECTED NONE DETECTED    Phencyclidine (PCP) Ur S NONE DETECTED NONE DETECTED   Cannabinoid 50 Ng, Ur [place] POSITIVE (A) NONE DETECTED   Barbiturates, Ur Screen NONE DETECTED NONE DETECTED   Benzodiazepine, Ur Scrn TEST NOT PERFORMED, REAGENT NOT AVAILABLE (A) NONE DETECTED   Methadone Scn, Ur NONE DETECTED NONE DETECTED   ____________________________________________ ____________________________________________   PROCEDURES  Procedure(s) performed:  Procedures    Critical Care performed: no ____________________________________________   INITIAL IMPRESSION / ASSESSMENT AND PLAN / ED COURSE  Pertinent labs & imaging results that were available during my care of the patient were reviewed by me and considered  in my medical decision making (see chart for details).   DDX: Psychosis, delirium, medication effect, noncompliance, polysubstance abuse, Si, Hi, depression   Neita Goodnightlijah Noralyn PickCarroll is a 22 y.o. who presents to the ED with for evaluation of SI.  Patient has psych history of SI.  Laboratory testing was ordered to evaluation for underlying electrolyte derangement or signs of underlying organic pathology to explain today's presentation.  Based on history and physical and laboratory evaluation, it appears that the patient's presentation is 2/2 underlying psychiatric disorder and will require further evaluation and management by inpatient psychiatry.  Patient was  made an IVC due to SI with h/o SI.  Disposition pending psychiatric evaluation.   Clinical Course as of Oct 29 2302  Wynelle LinkSun Oct 28, 2017  2249 Tele-psych evaluated the patient and reversed his IVC however I disagree with reversal at this point.  It appears that the tele-psychiatrist did not receive the cooperating evidence particularly concerns of TTS and peers regarding the patient's abnormal behavior as well as multiple concerning suicide attempts in the past including putting a gun in his mouth.   [PR]    Clinical Course User Index [PR] Willy Eddyobinson, Brittony Billick,  MD     As part of my medical decision making, I reviewed the following data within the electronic MEDICAL RECORD NUMBER Nursing notes reviewed and incorporated, Labs reviewed, notes from prior ED visits.   ____________________________________________   FINAL CLINICAL IMPRESSION(S) / ED DIAGNOSES  Final diagnoses:  Suicidal ideation      NEW MEDICATIONS STARTED DURING THIS VISIT:  New Prescriptions   No medications on file     Note:  This document was prepared using Dragon voice recognition software and may include unintentional dictation errors.    Willy Eddyobinson, Maui Ahart, MD 10/28/17 Julio [id number]    Willy Eddyobinson, Karesha Trzcinski, MD 10/28/17 [id number]

## 2017-10-28 NOTE — ED Notes (Signed)
Gave pt sandwich tray and drink.

## 2017-10-28 NOTE — BH Assessment (Signed)
Assessment Note  Jack Holmes is an 22 y.o. male who presents to ED reporting "Somebody thought I might hurt myself because of things I said earlier this week when I was drunk". Pt reports "my woman of 6 years just left me (July 2019) ... I drank and got really depressed. I was saying things like 'if I would just disappear things would be better'". Triage note also reported: Apparently also sent a text to a friend stating that he wanted to make sure that "his blood was on her hands (ex-girlfriend)". He appears to be ambivalent about his alcohol use and the extent of his suicidal gestures. He reports 2 suicidal attempts within the last 3-4 months. He has had 2 hospitalizations for depression and suicidal ideation. His reports his first admission in April 2019 was "when I had the barrel of a gun in my mouth and it didn't end well because I was tased for eluding arrest". In July 209, pt reports he attempted to hang himself but "chickened out" during the act and was admitted to [facility] after his girlfriend called the cops. He reported these intrusive suicidal thoughts have occurred since he was 22yo. He is linked with [facility] for MH treatment - he reports receiving therapy and medication management. He also reports medication compliance; however, he has not engaged in the therapy.  He reports he drank every day "last month" but attributes his drinking behaviors to "I just like the taste of whiskey". However, he endorses suicidal thoughts when he is intoxicated, stating "when I get properly liquored up is when I have these thoughts".  Patient was observed as being anxious, ambivalent, and adamant that he needed to leave the ER. He stated several times, "I don't need to be here". During the assessment pt offered to give his room to a patient waiting in the hallway stating, "she needs the help more than I do".  Diagnosis: Unspecified Depressive Disorder; Unspecified Personality Disorder; Alcohol Use Disorder,  Severe  Past Medical History: History reviewed. No pertinent past medical history.  History reviewed. No pertinent surgical history.  Family History: History reviewed. No pertinent family history.  Social History:  reports that he has quit smoking. He has never used smokeless tobacco. He reports that he drinks alcohol. He reports that he does not use drugs.  Additional Social History:  Alcohol / Drug Use Pain Medications: See MAR Prescriptions: See MAR Over the Counter: See MAR History of alcohol / drug use?: Yes Longest period of sobriety (when/how long): Unable to quantify Negative Consequences of Use: Personal relationships, Financial Substance #1 Name of Substance 1: Alcohol 1 - Age of First Use: 22yo 1 - Amount (size/oz): 3/4 whiskey 1 - Frequency: occasional, binge drinks under stress 1 - Duration: ongoing 1 - Last Use / Amount: 10/27/17  CIWA: CIWA-Ar BP: 128/78 Pulse Rate: (!) 104 COWS:    Allergies: No Known Allergies  Home Medications:  (Not in a hospital admission)  OB/GYN Status:  No LMP for male patient.  General Assessment Data Location of Assessment: [facility] ED TTS Assessment: In system Is this a Tele or Face-to-Face Assessment?: Face-to-Face Is this an Initial Assessment or a Re-assessment for this encounter?: Initial Assessment Marital status: Single Is patient pregnant?: No Pregnancy Status: No Living Arrangements: Alone Can pt return to current living arrangement?: Yes Admission Status: Involuntary Is patient capable of signing voluntary admission?: No Referral Source: Self/Family/Friend Insurance type: None  Medical Screening Exam [facility] Walk-in ONLY) Medical Exam completed: Yes  Crisis Care Plan  Living Arrangements: Alone Legal Guardian: Other:(Self) Name of Psychiatrist: none Name of Therapist: none  Education Status Is patient currently in school?: No Is the patient employed, unemployed or receiving disability?: Employed([employer])  Risk to self with the past 6 months Suicidal Ideation: No-Not Currently/Within Last 6 Months Has patient been a risk to self within the past 6 months prior to admission? : Yes Suicidal Intent: No-Not Currently/Within Last 6 Months Has patient had any suicidal intent within the past 6 months prior to admission? : Yes Is patient at risk for suicide?: Yes Suicidal Plan?: No-Not Currently/Within Last 6 Months Has patient had any suicidal plan within the past 6 months prior to admission? : Yes Specify Current Suicidal Plan: None reported Access to Means: Yes Specify Access to Suicidal Means: Pt has had access to means in the past What has been your use of drugs/alcohol within the last 12 months?: Alcohol Previous Attempts/Gestures: Yes How many times?: 3 Other Self Harm Risks: None reported Triggers for Past Attempts: Spouse contact, Other personal contacts Intentional Self Injurious Behavior: Cutting(Per chart, cutting) Comment - Self Injurious Behavior: Per chart, pt has hx to cutting himself Family Suicide History: No Recent stressful life event(s): Loss (Comment) Persecutory voices/beliefs?: No Depression: Yes Depression Symptoms: Feeling angry/irritable, Guilt, Isolating Substance abuse history and/or treatment for substance abuse?: Yes Suicide prevention information given to non-admitted patients: Not applicable  Risk to Others within the past 6 months Homicidal Ideation: No Does patient have any lifetime risk of violence toward others beyond the six months prior to admission? : No Thoughts of Harm to Others: No Current Homicidal Intent: No Current Homicidal Plan: No Access to Homicidal Means: No History of harm to others?: No Assessment of Violence: None Noted Does patient have access to weapons?: Yes (Comment)(Guns (per chart review)) Criminal Charges Pending?: No Does patient have a court date: No Is patient on probation?: No  Psychosis Hallucinations: None  noted Delusions: None noted  Mental Status Report Appearance/Hygiene: In scrubs Eye Contact: Good Motor Activity: Freedom of movement Speech: Logical/coherent Level of Consciousness: Alert Mood: Ambivalent Affect: Anxious Anxiety Level: Severe Thought Processes: Coherent, Relevant Judgement: Impaired Orientation: Person, Place, Time, Situation, Appropriate for developmental age Obsessive Compulsive Thoughts/Behaviors: None  Cognitive Functioning Concentration: Normal Memory: Recent Intact, Remote Intact Is patient IDD: No Is patient DD?: No Insight: Poor Impulse Control: Poor Appetite: Good Have you had any weight changes? : No Change Sleep: Increased Total Hours of Sleep: 10 Vegetative Symptoms: None  ADLScreening [facility] Assessment Services) Patient's cognitive ability adequate to safely complete daily activities?: Yes Patient able to express need for assistance with ADLs?: Yes Independently performs ADLs?: Yes (appropriate for developmental age)  Prior Inpatient Therapy Prior Inpatient Therapy: Yes Prior Therapy Dates: (06/2017; 09/2017) Prior Therapy Facilty/Provider(s): [facility] Reason for Treatment: MDD, SI  Prior Outpatient Therapy Prior Outpatient Therapy: Yes Prior Therapy Dates: Current Prior Therapy Facilty/Provider(s): [facility] Reason for Treatment: MDD, SI Does patient have an ACCT team?: No Does patient have Intensive In-House Services?  : No Does patient have [facility] services? : No Does patient have P4CC services?: No  ADL Screening (condition at time of admission) Patient's cognitive ability adequate to safely complete daily activities?: Yes Patient able to express need for assistance with ADLs?: Yes Independently performs ADLs?: Yes (appropriate for developmental age)       Abuse/Neglect Assessment (Assessment to be complete while patient is alone) Abuse/Neglect Assessment Can Be Completed: Yes Physical Abuse: Denies Verbal Abuse: Denies Sexual  Abuse:  Denies Exploitation of patient/patient's resources: Denies Self-Neglect: Denies Values / Beliefs Cultural Requests During Hospitalization: None Spiritual Requests During Hospitalization: None Consults Spiritual Care Consult Needed: No Social Work Consult Needed: No Merchant navy officerAdvance Directives (For Healthcare) Does Patient Have a Medical Advance Directive?: No Would patient like information on creating a medical advance directive?: No - Patient declined    Additional Information 1:1 In Past 12 Months?: No CIRT Risk: No Elopement Risk: No Does patient have medical clearance?: Yes  Child/Adolescent Assessment Running Away Risk: (Patient is an adult)  Disposition:  Disposition Initial Assessment Completed for this Encounter: Yes Disposition of Patient: Admit  On Site Evaluation by:   Reviewed with Physician:    Wilmon ArmsSTEVENSON, SHALETA 10/28/2017 8:16 PM

## 2017-10-28 NOTE — ED Triage Notes (Signed)
Pt presents to ED with [employer]. Pt states he was brought to ED because "Somebody thought I might hurt myself because of things I said earlier this week when I was drunk." Pt flatly denies SI/HI during triage, appears frustrated that he is here but is compliant at this time.

## 2017-10-28 NOTE — ED Notes (Signed)
Pt states he is unable to urinate at this time. Aware of need for sample.

## 2017-10-28 NOTE — ED Notes (Signed)
Patient assigned to appropriate care area   Introduced self to pt  Patient oriented to unit/care area: Informed that, for their safety, care areas are designed for safety and visiting and phone hours explained to patient. Patient verbalizes understanding, and verbal contract for safety obtained  Environment secured    Pt brought in by [employer]. Pt states he was brought to ED because "Somebody thought I might hurt myself because of things I said earlier this week when I was drunk." Pt flatly denies SI/HI during triage, appears frustrated that he is here but is compliant at this time. Patient appears to be minimizing his situation, patient has been here twice before and says he said something he did not mean

## 2017-10-29 DIAGNOSIS — F1994 Other psychoactive substance use, unspecified with psychoactive substance-induced mood disorder: Secondary | ICD-10-CM

## 2017-10-29 NOTE — ED Notes (Signed)
Patient discharged home, patient received discharge papers. Patient received belongings and verbalized he has received all of his belongings. Patient appropriate and cooperative, Denies SI/HI AVH. Vital signs taken. NAD noted. Patient received a note for work.

## 2017-10-29 NOTE — ED Notes (Signed)
Hourly rounding reveals patient sleeping in room. No complaints, stable, in no acute distress. Q15 minute rounds and monitoring via Security to continue. 

## 2017-10-29 NOTE — ED Notes (Signed)
Patient talking to Dr. Clapacs 

## 2017-10-29 NOTE — Discharge Instructions (Addendum)
Please seek medical attention and help for any thoughts about wanting to harm yourself, harm others, any concerning change in behavior, severe depression, inappropriate drug use or any other new or concerning symptoms. ° °

## 2017-10-29 NOTE — ED Provider Notes (Signed)
Dr. Toni Amendlapacs with psychiatry evaluated the patient. Feels he is safe for discharge. [facility]   Phineas SemenGoodman, Ananiah Maciolek, MD 10/29/17 1520

## 2017-10-29 NOTE — Consult Note (Signed)
[place] [facility] Consult   Reason for Consult: Consult for 22 year old man with a history of alcohol abuse and mood symptoms came into the hospital after making suicidal statements Referring Physician: Archie Balboa Patient Identification: Jack Holmes MRN:  [id number] Principal Diagnosis: Substance induced mood disorder ([place]) Diagnosis:   Patient Active Problem List   Diagnosis Date Noted  . Substance induced mood disorder ([place]) [F19.94] 10/29/2017  . MDD (major depressive disorder), recurrent severe, without psychosis ([place]) [F33.2] 09/22/2017  . Alcohol dependence with other alcohol-induced disorder ([place]) [F10.288]   . Major depressive disorder, single episode with mixed features [F32.9] 06/05/2017  . Posttraumatic stress disorder [F43.10] 06/05/2017  . Alcohol abuse [F10.10] 06/04/2017    Total Time spent with patient: 1 hour  Subjective:   Jack Holmes is a 22 y.o. male patient admitted with "a few days ago I was drinking a lot".  HPI: Patient seen chart reviewed.  Patient brought into the hospital when he made some statements implying suicidal ideation to some friends.  Patient was intoxicated at the time.  He was upset about a breakup with a girlfriend.  He had not actually done anything this time to hurt himself.  Patient says that for the most part his mood is reasonably good except for relating specific to this breakup.  He has been sleeping okay as health problems have been good.  He denies having any actual desire or thoughts of killing himself.  Admits that he has relapsed into drinking after having stayed off it for quite a while.  Not using any other drugs.  He does say that he continues to go to [place] and has been taking his psychiatric medicines.  Denies any psychotic symptoms.  Today says his mood is feeling better he is lucid not agitated and shows reasonably good insight.  Social history: Living by himself.  Has a job working on [employer].  Recent  breakup  Substance abuse history: History of alcohol abuse.  He says that he had actually been going to [employer] and had been staying sober recently until this break up and he relapsed.  Medical history: No significant medical problems  Past Psychiatric History: Patient actually has tried to kill himself more than once in the past and has a history of impulsivity and depression.  A lot of it also related to substance abuse.  He is a client at [facility].  Risk to Self: Suicidal Ideation: No-Not Currently/Within Last 6 Months Suicidal Intent: No-Not Currently/Within Last 6 Months Is patient at risk for suicide?: Yes Suicidal Plan?: No-Not Currently/Within Last 6 Months Specify Current Suicidal Plan: None reported Access to Means: Yes Specify Access to Suicidal Means: Pt has had access to means in the past What has been your use of drugs/alcohol within the last 12 months?: Alcohol How many times?: 3 Other Self Harm Risks: None reported Triggers for Past Attempts: Spouse contact, Other personal contacts Intentional Self Injurious Behavior: Cutting(Per chart, cutting) Comment - Self Injurious Behavior: Per chart, pt has hx to cutting himself Risk to Others: Homicidal Ideation: No Thoughts of Harm to Others: No Current Homicidal Intent: No Current Homicidal Plan: No Access to Homicidal Means: No History of harm to others?: No Assessment of Violence: None Noted Does patient have access to weapons?: Yes (Comment)(Guns (per chart review)) Criminal Charges Pending?: No Does patient have a court date: No Prior Inpatient Therapy: Prior Inpatient Therapy: Yes Prior Therapy Dates: (06/2017; 09/2017) Prior Therapy Facilty/Provider(s): [facility] Reason for Treatment: MDD, SI Prior Outpatient Therapy: Prior  Outpatient Therapy: Yes Prior Therapy Dates: Current Prior Therapy Facilty/Provider(s): [facility] Reason for Treatment: MDD, SI Does patient have an ACCT team?: No Does patient have Intensive In-House  Services?  : No Does patient have [facility] services? : No Does patient have P4CC services?: No  Past Medical History: History reviewed. No pertinent past medical history. History reviewed. No pertinent surgical history. Family History: History reviewed. No pertinent family history. Family Psychiatric  History: None known Social History:  Social History   Substance and Sexual Activity  Alcohol Use Yes   Comment: pt states "a lot" but not daily     Social History   Substance and Sexual Activity  Drug Use No    Social History   Socioeconomic History  . Marital status: Single    Spouse name: Not on file  . Number of children: Not on file  . Years of education: Not on file  . Highest education level: Not on file  Occupational History  . Not on file  Social Needs  . Financial resource strain: Not on file  . Food insecurity:    Worry: Not on file    Inability: Not on file  . Transportation needs:    Medical: Not on file    Non-medical: Not on file  Tobacco Use  . Smoking status: Former Research scientist (life sciences)  . Smokeless tobacco: Never Used  Substance and Sexual Activity  . Alcohol use: Yes    Comment: pt states "a lot" but not daily  . Drug use: No  . Sexual activity: Yes    Birth control/protection: Condom  Lifestyle  . Physical activity:    Days per week: Not on file    Minutes per session: Not on file  . Stress: Not on file  Relationships  . Social connections:    Talks on phone: Not on file    Gets together: Not on file    Attends religious service: Not on file    Active member of club or organization: Not on file    Attends meetings of clubs or organizations: Not on file    Relationship status: Not on file  Other Topics Concern  . Not on file  Social History Narrative  . Not on file   Additional Social History:    Allergies:  No Known Allergies  Labs:  Results for orders placed or performed during the hospital encounter of 10/28/17 (from the past 48 hour(s))   Comprehensive metabolic panel     Status: Abnormal   Collection Time: 10/28/17  4:43 PM  Result Value Ref Range   Sodium 140 135 - 145 mmol/L   Potassium 4.1 3.5 - 5.1 mmol/L   Chloride 106 98 - 111 mmol/L   CO2 28 22 - 32 mmol/L   Glucose, Bld 111 (H) 70 - 99 mg/dL   BUN 8 6 - 20 mg/dL   Creatinine, Ser 1.05 0.61 - 1.24 mg/dL   Calcium 9.4 8.9 - 10.3 mg/dL   Total Protein 7.6 6.5 - 8.1 g/dL   Albumin 4.8 3.5 - 5.0 g/dL   AST 23 15 - 41 U/L   ALT 24 0 - 44 U/L   Alkaline Phosphatase 96 38 - 126 U/L   Total Bilirubin 0.6 0.3 - 1.2 mg/dL   GFR calc non Af Amer >60 >60 mL/min   GFR calc Af Amer >60 >60 mL/min    Comment: (NOTE) The eGFR has been calculated using the CKD EPI equation. This calculation has not been validated in all  clinical situations. eGFR's persistently <60 mL/min signify possible Chronic Kidney Disease.    Anion gap 6 5 - 15    Comment: Performed at [facility], [address] [place], [place] [address]  Ethanol     Status: None   Collection Time: 10/28/17  4:43 PM  Result Value Ref Range   Alcohol, Ethyl (B) <10 <10 mg/dL    Comment: (NOTE) Lowest detectable limit for serum alcohol is 10 mg/dL. For medical purposes only. Performed at [facility], [address] [place], [place] [address]   Salicylate level     Status: None   Collection Time: 10/28/17  4:43 PM  Result Value Ref Range   Salicylate Lvl <5.7 2.8 - 30.0 mg/dL    Comment: Performed at [facility], [address] [place], [place] [address]  Acetaminophen level     Status: Abnormal   Collection Time: 10/28/17  4:43 PM  Result Value Ref Range   Acetaminophen (Tylenol), Serum <10 (L) 10 - 30 ug/mL    Comment: (NOTE) Therapeutic concentrations vary significantly. A range of 10-30 ug/mL  may be an effective concentration for many patients. However, some  are best treated at concentrations outside of this range. Acetaminophen concentrations >150 ug/mL at 4  hours after ingestion  and >50 ug/mL at 12 hours after ingestion are often associated with  toxic reactions. Performed at [facility], [address] [place], [place] [address]   cbc     Status: None   Collection Time: 10/28/17  4:43 PM  Result Value Ref Range   WBC 5.6 3.8 - 10.6 K/uL   RBC 5.50 4.40 - 5.90 MIL/uL   Hemoglobin 16.7 13.0 - 18.0 g/dL   HCT 49.1 40.0 - 52.0 %   MCV 89.2 80.0 - 100.0 fL   MCH 30.4 26.0 - 34.0 pg   MCHC 34.1 32.0 - 36.0 g/dL   RDW 13.4 11.5 - 14.5 %   Platelets 213 150 - 440 K/uL    Comment: Performed at [facility], [address], [place], [place] [address]  Urine Drug Screen, Qualitative     Status: Abnormal   Collection Time: 10/28/17  4:45 PM  Result Value Ref Range   Tricyclic, Ur Screen NONE DETECTED NONE DETECTED   Amphetamines, Ur Screen NONE DETECTED NONE DETECTED   MDMA (Ecstasy)Ur Screen NONE DETECTED NONE DETECTED   Cocaine Metabolite,Ur [place] NONE DETECTED NONE DETECTED   Opiate, Ur Screen NONE DETECTED NONE DETECTED   Phencyclidine (PCP) Ur S NONE DETECTED NONE DETECTED   Cannabinoid 50 Ng, Ur [place] POSITIVE (A) NONE DETECTED   Barbiturates, Ur Screen NONE DETECTED NONE DETECTED   Benzodiazepine, Ur Scrn TEST NOT PERFORMED, REAGENT NOT AVAILABLE (A) NONE DETECTED   Methadone Scn, Ur NONE DETECTED NONE DETECTED    Comment: (NOTE) Tricyclics + metabolites, urine    Cutoff 1000 ng/mL Amphetamines + metabolites, urine  Cutoff 1000 ng/mL MDMA (Ecstasy), urine              Cutoff 500 ng/mL Cocaine Metabolite, urine          Cutoff 300 ng/mL Opiate + metabolites, urine        Cutoff 300 ng/mL Phencyclidine (PCP), urine         Cutoff 25 ng/mL Cannabinoid, urine                 Cutoff 50 ng/mL Barbiturates + metabolites, urine  Cutoff 200 ng/mL Benzodiazepine, urine  Cutoff 200 ng/mL Methadone, urine                   Cutoff 300 ng/mL The urine drug screen provides only a preliminary, unconfirmed analytical  test result and should not be used for non-medical purposes. Clinical consideration and professional judgment should be applied to any positive drug screen result due to possible interfering substances. A more specific alternate chemical method must be used in order to obtain a confirmed analytical result. Gas chromatography / mass spectrometry (GC/MS) is the preferred confirmat ory method. Performed at [facility], [address] [place],  [address]     No current facility-administered medications for this encounter.    Current Outpatient Medications  Medication Sig Dispense Refill  . divalproex (DEPAKOTE ER) 500 MG 24 hr tablet Take 2 tablets (1,000 mg total) by mouth at bedtime. 60 tablet 0  . hydrOXYzine (ATARAX/VISTARIL) 25 MG tablet Take 1 tablet (25 mg total) by mouth 3 (three) times daily as needed (mild/moderate anxiety). 30 tablet 0  . prazosin (MINIPRESS) 1 MG capsule Take 1 capsule (1 mg total) by mouth at bedtime. 30 capsule 0  . sertraline (ZOLOFT) 25 MG tablet Take 3 tablets (75 mg total) by mouth daily. 30 tablet 0  . traZODone (DESYREL) 50 MG tablet Take 1 tablet (50 mg total) by mouth at bedtime as needed for sleep. 30 tablet 0    Musculoskeletal: Strength & Muscle Tone: within normal limits Gait & Station: normal Patient leans: N/A  Psychiatric Specialty Exam: Physical Exam  Nursing note and vitals reviewed. Constitutional: He appears well-developed and well-nourished.  HENT:  Head: Normocephalic and atraumatic.  Eyes: Pupils are equal, round, and reactive to light. Conjunctivae are normal.  Neck: Normal range of motion.  Cardiovascular: Regular rhythm and normal heart sounds.  Respiratory: Effort normal. No respiratory distress.  GI: Soft.  Musculoskeletal: Normal range of motion.  Neurological: He is alert.  Skin: Skin is warm and dry.  Psychiatric: He has a normal mood and affect. His speech is normal and behavior is normal. Judgment  and thought content normal. Cognition and memory are normal.    Review of Systems  Constitutional: Negative.   HENT: Negative.   Eyes: Negative.   Respiratory: Negative.   Cardiovascular: Negative.   Gastrointestinal: Negative.   Musculoskeletal: Negative.   Skin: Negative.   Neurological: Negative.   Psychiatric/Behavioral: Positive for substance abuse. Negative for depression, hallucinations, memory loss and suicidal ideas. The patient is not nervous/anxious and does not have insomnia.     Blood pressure 135/78, pulse 79, temperature 98 F (36.7 C), temperature source Oral, resp. rate 18, height 6' 2"  (1.88 m), weight 95.3 kg, SpO2 100 %.Body mass index is 26.96 kg/m.  General Appearance: Fairly Groomed  Eye Contact:  Good  Speech:  Clear and Coherent  Volume:  Normal  Mood:  Euthymic  Affect:  Congruent  Thought Process:  Goal Directed  Orientation:  Full (Time, Place, and Person)  Thought Content:  Logical  Suicidal Thoughts:  No  Homicidal Thoughts:  No  Memory:  Immediate;   Fair Recent;   Fair Remote;   Fair  Judgement:  Fair  Insight:  Fair  Psychomotor Activity:  Normal  Concentration:  Concentration: Fair  Recall:  [employer] of Knowledge:  Fair  Language:  Fair  Akathisia:  No  Handed:  Right  AIMS (if indicated):     Assets:  Desire for [place]  ADL's:  Intact  Cognition:  WNL  Sleep:        Treatment Plan Summary: Daily contact with patient to assess and evaluate symptoms and progress in treatment, Medication management and Plan Patient appears to be showing improved insight.  Not acutely suicidal.  Patient is calm and agreeable to outpatient treatment in the community.  No longer meets commitment criteria.  Discontinue IVC.  Patient will follow up at [facility].  Psychoeducation done labs reviewed case reviewed with emergency room doctor.  Disposition: No evidence of imminent risk to self or others at present.    Patient does not meet criteria for psychiatric inpatient admission.  Alethia Berthold, MD 10/29/2017 6:46 PM

## 2017-11-10 ENCOUNTER — Emergency Department
Admission: EM | Admit: 2017-11-10 | Discharge: 2017-11-11 | Disposition: A | Discharge status: Home / Self Care | Payer: Self-pay | Attending: Emergency Medicine | Admitting: Emergency Medicine

## 2017-11-10 ENCOUNTER — Other Ambulatory Visit: Payer: Self-pay

## 2017-11-10 DIAGNOSIS — R45851 Suicidal ideations: Secondary | ICD-10-CM | POA: Insufficient documentation

## 2017-11-10 DIAGNOSIS — F4310 Post-traumatic stress disorder, unspecified: Secondary | ICD-10-CM | POA: Diagnosis present

## 2017-11-10 DIAGNOSIS — F39 Unspecified mood [affective] disorder: Secondary | ICD-10-CM | POA: Insufficient documentation

## 2017-11-10 DIAGNOSIS — F332 Major depressive disorder, recurrent severe without psychotic features: Secondary | ICD-10-CM | POA: Diagnosis present

## 2017-11-10 DIAGNOSIS — Z87891 Personal history of nicotine dependence: Secondary | ICD-10-CM | POA: Insufficient documentation

## 2017-11-10 DIAGNOSIS — Z7289 Other problems related to lifestyle: Secondary | ICD-10-CM | POA: Insufficient documentation

## 2017-11-10 DIAGNOSIS — F1994 Other psychoactive substance use, unspecified with psychoactive substance-induced mood disorder: Secondary | ICD-10-CM | POA: Diagnosis present

## 2017-11-10 DIAGNOSIS — F10288 Alcohol dependence with other alcohol-induced disorder: Secondary | ICD-10-CM | POA: Diagnosis present

## 2017-11-10 DIAGNOSIS — Z046 Encounter for general psychiatric examination, requested by authority: Secondary | ICD-10-CM | POA: Insufficient documentation

## 2017-11-10 DIAGNOSIS — F329 Major depressive disorder, single episode, unspecified: Secondary | ICD-10-CM | POA: Insufficient documentation

## 2017-11-10 HISTORY — DX: Anxiety disorder, unspecified: F41.9

## 2017-11-10 HISTORY — DX: Depression, unspecified: F32.A

## 2017-11-10 HISTORY — DX: Post-traumatic stress disorder, unspecified: F43.10

## 2017-11-10 HISTORY — DX: Concussion with loss of consciousness status unknown, initial encounter: S06.0XAA

## 2017-11-10 HISTORY — DX: Concussion with loss of consciousness of unspecified duration, initial encounter: S06.0X9A

## 2017-11-10 HISTORY — DX: Major depressive disorder, single episode, unspecified: F32.9

## 2017-11-10 LAB — URINE DRUG SCREEN, QUALITATIVE (ARMC ONLY)
Amphetamines, Ur Screen: NOT DETECTED
Barbiturates, Ur Screen: NOT DETECTED
Benzodiazepine, Ur Scrn: NOT DETECTED
Cannabinoid 50 Ng, Ur ~~LOC~~: NOT DETECTED
Cocaine Metabolite,Ur ~~LOC~~: NOT DETECTED
MDMA (Ecstasy)Ur Screen: NOT DETECTED
METHADONE SCREEN, URINE: NOT DETECTED
OPIATE, UR SCREEN: NOT DETECTED
Phencyclidine (PCP) Ur S: NOT DETECTED
Tricyclic, Ur Screen: NOT DETECTED

## 2017-11-10 LAB — COMPREHENSIVE METABOLIC PANEL
ALBUMIN: 4.5 g/dL (ref 3.5–5.0)
ALT: 19 U/L (ref 0–44)
AST: 21 U/L (ref 15–41)
Alkaline Phosphatase: 89 U/L (ref 38–126)
Anion gap: 9 (ref 5–15)
BILIRUBIN TOTAL: 0.9 mg/dL (ref 0.3–1.2)
BUN: 12 mg/dL (ref 6–20)
CO2: 26 mmol/L (ref 22–32)
CREATININE: 0.86 mg/dL (ref 0.61–1.24)
Calcium: 9.7 mg/dL (ref 8.9–10.3)
Chloride: 108 mmol/L (ref 98–111)
GFR calc Af Amer: >60 mL/min (ref >60)
GLUCOSE: 140 mg/dL — AB (ref 70–99)
Potassium: 3.8 mmol/L (ref 3.5–5.1)
Sodium: 143 mmol/L (ref 135–145)
TOTAL PROTEIN: 7.5 g/dL (ref 6.5–8.1)

## 2017-11-10 LAB — CBC
HCT: 45.7 % (ref 40.0–52.0)
Hemoglobin: 15.9 g/dL (ref 13.0–18.0)
MCH: 30.8 pg (ref 26.0–34.0)
MCHC: 34.8 g/dL (ref 32.0–36.0)
MCV: 88.6 fL (ref 80.0–100.0)
Platelets: 216 10*3/uL (ref 150–440)
RBC: 5.17 MIL/uL (ref 4.40–5.90)
RDW: 13.1 % (ref 11.5–14.5)
WBC: 7.1 10*3/uL (ref 3.8–10.6)

## 2017-11-10 LAB — ETHANOL

## 2017-11-10 MED ORDER — HYDROXYZINE HCL 25 MG PO TABS: 50.0000 mg | ORAL_TABLET | Freq: Every day | ORAL | Status: DC | PRN

## 2017-11-10 NOTE — ED Notes (Signed)
Pt observed lying in bed watching a ball game  NAD observed  Continue to monitor

## 2017-11-10 NOTE — ED Notes (Signed)

## 2017-11-10 NOTE — ED Notes (Signed)

## 2017-11-10 NOTE — ED Notes (Signed)
IVC 

## 2017-11-10 NOTE — ED Triage Notes (Signed)
FIRST NURSE NOTE-Here IVC with police. Placed in chair in triage with [employer] to wait for triage.

## 2017-11-10 NOTE — ED Provider Notes (Signed)
[facility] Emergency Department Provider Note       Time seen: ----------------------------------------- 4:02 PM on 11/10/2017 -----------------------------------------   I have reviewed the triage vital signs and the nursing notes.  HISTORY   Chief Complaint Behavioral Evaluation    HPI Jack Holmes is a 22 y.o. male with a history of exotic, concussion, depression, PTSD, substance induced mood disorder who presents to the ED for involuntary commitment.  Patient was brought in by police.  Patient does not think he is under involuntary commitment, states he came voluntarily.  She states he was upset earlier when he saw his ex-girlfriend of 6 years and her new boyfriend.  They broke up about a month ago he states this is been difficult for him.  Please state that his house is in significant disarray and he does not seem to be caring for himself.  He had made some cuts to his left arm, he also admitted earlier to not have had not anything to eat for the last day and a half.  Past Medical History:  Diagnosis Date  . Anxiety   . Concussion   . Depression   . PTSD (post-traumatic stress disorder)     Patient Active Problem List   Diagnosis Date Noted  . Substance induced mood disorder (H[facility]) 10/29/2017  . MDD (major depressive disorder), recurrent severe, without psychosis ([facility]) 09/22/2017  . 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 Major depressive disorder, single episode with mixed features 06/05/2017  . Posttraumatic stress disorder 06/05/2017  . Alcohol abuse 06/04/2017    History reviewed. No pertinent surgical history.  Allergies Patient has no known allergies.  Social History Social History   Tobacco Use  . Smoking status: Former Games developer  . Smokeless tobacco: Never Used  Substance Use Topics  . Alcohol use: Yes    Comment: pt states "a lot" but not daily  . Drug use: No   Review of Systems Constitutional:  Negative for fever. Cardiovascular: Negative for chest pain. Respiratory: Negative for shortness of breath. Gastrointestinal: Negative for abdominal pain, vomiting and diarrhea. Musculoskeletal: Negative for back pain. Skin: Positive for superficial lacerations on his left arm Neurological: Negative for headaches, focal weakness or numbness. Psychiatric: Negative for homicidal or suicidal ideation at this time  All systems negative/normal/unremarkable except as stated in the HPI  ____________________________________________   PHYSICAL EXAM:  VITAL SIGNS: ED Triage Vitals [11/10/17 1538]  Enc Vitals Group     BP      Pulse      Resp      Temp      Temp src      SpO2      Weight 210 lb (95.3 kg)     Height 6\' 2"  (1.88 m)     Head Circumference      Peak Flow      Pain Score 0     Pain Loc      Pain Edu?      Excl. in GC?    Constitutional: Alert and oriented. Well appearing and in no distress. Eyes: Conjunctivae are normal. Normal extraocular movements. Cardiovascular: Normal rate, regular rhythm. No murmurs, rubs, or gallops. Respiratory: Normal respiratory effort without tachypnea nor retractions. Breath sounds are clear and equal bilaterally. No wheezes/rales/rhonchi. Gastrointestinal: Soft and nontender. Normal bowel sounds Musculoskeletal: Nontender with normal range of motion in extremities. No lower extremity tenderness nor edema. Neurologic:  Normal speech and language. No gross focal neurologic deficits are appreciated.  Skin:  Skin is warm, dry and intact. No rash noted. Psychiatric: Mood and affect are normal. Speech and behavior are normal.  ____________________________________________  ED COURSE:  As part of my medical decision making, I reviewed the following data within the electronic MEDICAL RECORD NUMBER History obtained from family if available, nursing notes, old chart and ekg, as well as notes from prior ED visits. Patient presented for involuntary  commitment, we will assess with labs as indicated at this time   Procedures ____________________________________________   LABS (pertinent positives/negatives)  Labs Reviewed  COMPREHENSIVE METABOLIC PANEL - Abnormal; Notable for the following components:      Result Value   Glucose, Bld 140 (*)    All other components within normal limits  ETHANOL  CBC  URINE DRUG SCREEN, QUALITATIVE ([facility] ONLY)   ____________________________________________  DIFFERENTIAL DIAGNOSIS   Major depression, alcohol intoxication, PTSD, substance abuse  FINAL ASSESSMENT AND PLAN  Suicidal ideation, alcohol use disorder, self-mutilation   Plan: The patient had presented for suicidal ideation. Patient's labs do not reveal any acute process, he appears medically stable for psychiatric evaluation and disposition.    Ulice Dash, MD   Note: This note was generated in part or whole with voice recognition software. Voice recognition is usually quite accurate but there are transcription errors that can and very often do occur. I apologize for any typographical errors that were not detected and corrected.     Emily Filbert, MD 11/10/17 [id number]

## 2017-11-10 NOTE — ED Notes (Signed)
BEHAVIORAL HEALTH ROUNDING Patient sleeping: No. Patient alert and oriented: yes Behavior appropriate: Yes.  ; If no, describe:  Nutrition and fluids offered: yes Toileting and hygiene offered: Yes  Sitter present: q15 minute observations and security  monitoring Law enforcement present: Yes  ODS  

## 2017-11-11 DIAGNOSIS — F1994 Other psychoactive substance use, unspecified with psychoactive substance-induced mood disorder: Secondary | ICD-10-CM

## 2017-11-11 MED ORDER — SERTRALINE HCL 50 MG PO TABS
75.0000 mg | ORAL_TABLET | ORAL | Status: AC
  Administered 2017-11-11: 75 mg via ORAL
  Filled 2017-11-11: qty 2

## 2017-11-11 MED ORDER — DIVALPROEX SODIUM 500 MG PO DR TAB
500.0000 mg | DELAYED_RELEASE_TABLET | ORAL | Status: AC
  Administered 2017-11-11: 500 mg via ORAL
  Filled 2017-11-11: qty 1

## 2017-11-11 MED ORDER — HYDROXYZINE HCL 25 MG PO TABS
25.0000 mg | ORAL_TABLET | ORAL | Status: AC
  Administered 2017-11-11: 25 mg via ORAL
  Filled 2017-11-11: qty 1

## 2017-11-11 NOTE — ED Notes (Signed)
Patient discharge home. Patient alert, oriented and ambulatory at discharge. Patient AVS/Follow-up/Medications reviewed with him and a written copy given to patient and he verbalized understanding. Patient given suicide number to call if needed. Patient vitals at discharge 98.2-130/75-87-18-100%. Patient escorted to lobby by this Clinical research associate. Patient instructed to have front desk to call me if problems with transportation with police officer not being able to pick him up. All patient belongings returned to him. Patient denies SI/HI and A/V hallucinations at discharge.

## 2017-11-11 NOTE — ED Provider Notes (Signed)
-----------------------------------------   1:44 PM on 11/11/2017 -----------------------------------------  The patient has been evaluated by Dr. Toni Amend from psychiatry, who has rescinded the IVC.  He has cleared the patient for discharge.  The patient has been provided with medications here.  Dr. Toni Amend advised that the patient will follow-up at [facility].  He is stable for discharge home at this time.  Return precautions provided.   Dionne Bucy, MD 11/11/17 1344

## 2017-11-11 NOTE — ED Notes (Signed)
IVC rescinded/ SOC completed 

## 2017-11-11 NOTE — ED Notes (Signed)
Pt given breakfast tray and ask if there was news on him. I told pt I had just arrived and that I would inform RN of question.

## 2017-11-11 NOTE — Consult Note (Signed)
[facility] Consult   Reason for Consult: Consult for 22 year old man with a history of mood disorder and substance abuse comes into the hospital after a friend called law enforcement and out of concern for suicidal ideation Referring Physician: Siadecki Patient Identification: Jack Holmes MRN:  [id number] Principal Diagnosis: Substance induced mood disorder ([place]) Diagnosis:   Patient Active Problem List   Diagnosis Date Noted  . Substance induced mood disorder ([place]) [F19.94] 10/29/2017  . MDD (major depressive disorder), recurrent severe, without psychosis ([place]) [F33.2] 09/22/2017  . Alcohol dependence with other alcohol-induced disorder ([place]) [F10.288]   . Major depressive disorder, single episode with mixed features [F32.9] 06/05/2017  . Posttraumatic stress disorder [F43.10] 06/05/2017  . Alcohol abuse [F10.10] 06/04/2017    Total Time spent with patient: 1 hour  Subjective:   Jack Holmes is a 22 y.o. male patient admitted with "I was just upset".  HPI: Patient seen chart reviewed.  Patient says that yesterday his ex-girlfriend had to come by the house where he is living to pick up some of her belongings.  He tried to avoid being there because there is a restraining order in place but he did catch site of her with her new boyfriend.  The fact that the new boyfriend was there particularly upset him.  Patient also admits he consumed a beer yesterday which she knows he probably should not do.  He says he was upset and was talking to another friend and made comments that implied suicidality.  He denies that he actually did anything to try and hurt himself.  He does have some cuts on his forearm which he claims he had done a couple of days ago.  Patient's mood is chronically nervous and depressed.  Sleep can be erratic at times.  Appetite adequate.  Not having any hallucinations or psychotic symptoms.  He says he is compliant regularly with his current prescribed  psychiatric medicine.  He has a lot of stress he is going through because he needs to find a new place to live in the next couple days and is still suffering with a lot of bad feelings about the breakup with his girlfriend.  Denies using any other drugs.  Denies any actual thoughts of doing anything violent to anyone else.  Denies any psychotic symptoms.  Social history: Currently living in a house that he has to vacate in the next couple days.  Feels he can probably go stay with family or some other friends.  Not currently working having lost his most recent job.  Medical history: Very superficial cuts to the arm but no need for any acute treatment no other active medical problems  Substance abuse history: History of abuse of alcohol greatly worsening his chronic mood symptoms.  No history of DTs or seizures.  Past Psychiatric History: Patient has a history of mood instability.  He has been psychiatrically hospitalized in the past.  Diagnosis of bipolar disorder and PTSD but everything has been complicated by intoxication.  He is currently on Depakote and Zoloft and goes to [facility].  Risk to Self: Suicidal Ideation: No-Not Currently/Within Last 6 Months Suicidal Intent: No-Not Currently/Within Last 6 Months Is patient at risk for suicide?: Yes Suicidal Plan?: No-Not Currently/Within Last 6 Months Specify Current Suicidal Plan: none reported Access to Means: Yes Specify Access to Suicidal Means: pt has access to items such as belts What has been your use of drugs/alcohol within the last 12 months?: alcohol use daily How many times?: 3 Other  Self Harm Risks: none reported Triggers for Past Attempts: Spouse contact Intentional Self Injurious Behavior: Cutting Comment - Self Injurious Behavior: pt hx of cutting, last tight was earlier in the day Risk to Others: Homicidal Ideation: No Thoughts of Harm to Others: No-Not Currently Present/Within Last 6 Months Current Homicidal Intent: No Current  Homicidal Plan: No Access to Homicidal Means: No Identified Victim: Pt reports to being angry and having thoughts of wanting hurt ex girlfriend and new boyfriend. History of harm to others?: No Assessment of Violence: None Noted Does patient have access to weapons?: Yes (Comment)(Pt denies any weapons, however guns present inpast) Criminal Charges Pending?: No Does patient have a court date: No Prior Inpatient Therapy: Prior Inpatient Therapy: Yes Prior Therapy Dates: 2019-   >1 Prior Therapy Facilty/Provider(s): [facility] Reason for Treatment: MDD, SO Prior Outpatient Therapy: Prior Outpatient Therapy: Yes Prior Therapy Dates: current Prior Therapy Facilty/Provider(s): [facility] Reason for Treatment: MDD, SI Does patient have an ACCT team?: No Does patient have Intensive In-House Services?  : No Does patient have [facility] services? : No Does patient have P4CC services?: No  Past Medical History:  Past Medical History:  Diagnosis Date  . Anxiety   . Concussion   . Depression   . PTSD (post-traumatic stress disorder)    History reviewed. No pertinent surgical history. Family History: History reviewed. No pertinent family history. Family Psychiatric  History: Denies any Social History:  Social History   Substance and Sexual Activity  Alcohol Use Yes   Comment: pt states "a lot" but not daily     Social History   Substance and Sexual Activity  Drug Use No    Social History   Socioeconomic History  . Marital status: Single    Spouse name: Not on file  . Number of children: Not on file  . Years of education: Not on file  . Highest education level: Not on file  Occupational History  . Not on file  Social Needs  . Financial resource strain: Not on file  . Food insecurity:    Worry: Not on file    Inability: Not on file  . Transportation needs:    Medical: Not on file    Non-medical: Not on file  Tobacco Use  . Smoking status: Former Research scientist (life sciences)  . Smokeless tobacco: Never Used    Substance and Sexual Activity  . Alcohol use: Yes    Comment: pt states "a lot" but not daily  . Drug use: No  . Sexual activity: Yes    Birth control/protection: Condom  Lifestyle  . Physical activity:    Days per week: Not on file    Minutes per session: Not on file  . Stress: Not on file  Relationships  . Social connections:    Talks on phone: Not on file    Gets together: Not on file    Attends religious service: Not on file    Active member of club or organization: Not on file    Attends meetings of clubs or organizations: Not on file    Relationship status: Not on file  Other Topics Concern  . Not on file  Social History Narrative  . Not on file   Additional Social History:    Allergies:  No Known Allergies  Labs:  Results for orders placed or performed during the hospital encounter of 11/10/17 (from the past 48 hour(s))  Comprehensive metabolic panel     Status: Abnormal   Collection Time: 11/10/17  3:48  PM  Result Value Ref Range   Sodium 143 135 - 145 mmol/L   Potassium 3.8 3.5 - 5.1 mmol/L   Chloride 108 98 - 111 mmol/L   CO2 26 22 - 32 mmol/L   Glucose, Bld 140 (H) 70 - 99 mg/dL   BUN 12 6 - 20 mg/dL   Creatinine, Ser 0.86 0.61 - 1.24 mg/dL   Calcium 9.7 8.9 - 10.3 mg/dL   Total Protein 7.5 6.5 - 8.1 g/dL   Albumin 4.5 3.5 - 5.0 g/dL   AST 21 15 - 41 U/L   ALT 19 0 - 44 U/L   Alkaline Phosphatase 89 38 - 126 U/L   Total Bilirubin 0.9 0.3 - 1.2 mg/dL   GFR calc non Af Amer >60 >60 mL/min   GFR calc Af Amer >60 >60 mL/min    Comment: (NOTE) The eGFR has been calculated using the CKD EPI equation. This calculation has not been validated in all clinical situations. eGFR's persistently <60 mL/min signify possible Chronic Kidney Disease.    Anion gap 9 5 - 15    Comment: Performed at [facility], [address] [place], [place] [address]  Ethanol     Status: None   Collection Time: 11/10/17  3:48 PM  Result Value Ref Range   Alcohol,  Ethyl (B) <10 <10 mg/dL    Comment: (NOTE) Lowest detectable limit for serum alcohol is 10 mg/dL. For medical purposes only. Performed at [facility], [address] [place], [place] [address]   cbc     Status: None   Collection Time: 11/10/17  3:48 PM  Result Value Ref Range   WBC 7.1 3.8 - 10.6 K/uL   RBC 5.17 4.40 - 5.90 MIL/uL   Hemoglobin 15.9 13.0 - 18.0 g/dL   HCT 45.7 40.0 - 52.0 %   MCV 88.6 80.0 - 100.0 fL   MCH 30.8 26.0 - 34.0 pg   MCHC 34.8 32.0 - 36.0 g/dL   RDW 13.1 11.5 - 14.5 %   Platelets 216 150 - 440 K/uL    Comment: Performed at [facility], [address], [place], [place] [address]  Urine Drug Screen, Qualitative     Status: None   Collection Time: 11/10/17  3:48 PM  Result Value Ref Range   Tricyclic, Ur Screen NONE DETECTED NONE DETECTED   Amphetamines, Ur Screen NONE DETECTED NONE DETECTED   MDMA (Ecstasy)Ur Screen NONE DETECTED NONE DETECTED   Cocaine Metabolite,Ur [place] NONE DETECTED NONE DETECTED   Opiate, Ur Screen NONE DETECTED NONE DETECTED   Phencyclidine (PCP) Ur S NONE DETECTED NONE DETECTED   Cannabinoid 50 Ng, Ur  NONE DETECTED NONE DETECTED   Barbiturates, Ur Screen NONE DETECTED NONE DETECTED   Benzodiazepine, Ur Scrn NONE DETECTED NONE DETECTED   Methadone Scn, Ur NONE DETECTED NONE DETECTED    Comment: (NOTE) Tricyclics + metabolites, urine    Cutoff 1000 ng/mL Amphetamines + metabolites, urine  Cutoff 1000 ng/mL MDMA (Ecstasy), urine              Cutoff 500 ng/mL Cocaine Metabolite, urine          Cutoff 300 ng/mL Opiate + metabolites, urine        Cutoff 300 ng/mL Phencyclidine (PCP), urine         Cutoff 25 ng/mL Cannabinoid, urine                 Cutoff 50 ng/mL Barbiturates + metabolites, urine  Cutoff 200 ng/mL Benzodiazepine, urine              Cutoff 200 ng/mL Methadone, urine                   Cutoff 300 ng/mL The urine drug screen provides only a preliminary, unconfirmed analytical test result and  should not be used for non-medical purposes. Clinical consideration and professional judgment should be applied to any positive drug screen result due to possible interfering substances. A more specific alternate chemical method must be used in order to obtain a confirmed analytical result. Gas chromatography / mass spectrometry (GC/MS) is the preferred confirmat ory method. Performed at [facility], [address] [place], [place] [address]     Current Facility-Administered Medications  Medication Dose Route Frequency Provider Last Rate Last Dose  . divalproex (DEPAKOTE) DR tablet 500 mg  500 mg Oral NOW Harleyquinn Gasser T, MD      . hydrOXYzine (ATARAX/VISTARIL) tablet 25 mg  25 mg Oral NOW Traci Plemons T, MD      . hydrOXYzine (ATARAX/VISTARIL) tablet 50 mg  50 mg Oral Daily PRN Earleen Newport, MD      . sertraline (ZOLOFT) tablet 75 mg  75 mg Oral NOW Mallery Harshman, Madie Reno, MD       Current Outpatient Medications  Medication Sig Dispense Refill  . divalproex (DEPAKOTE ER) 500 MG 24 hr tablet Take 2 tablets (1,000 mg total) by mouth at bedtime. 60 tablet 0  . hydrOXYzine (ATARAX/VISTARIL) 25 MG tablet Take 1 tablet (25 mg total) by mouth 3 (three) times daily as needed (mild/moderate anxiety). 30 tablet 0  . prazosin (MINIPRESS) 1 MG capsule Take 1 capsule (1 mg total) by mouth at bedtime. 30 capsule 0  . sertraline (ZOLOFT) 25 MG tablet Take 3 tablets (75 mg total) by mouth daily. 30 tablet 0  . traZODone (DESYREL) 50 MG tablet Take 1 tablet (50 mg total) by mouth at bedtime as needed for sleep. 30 tablet 0    Musculoskeletal: Strength & Muscle Tone: within normal limits Gait & Station: normal Patient leans: N/A  Psychiatric Specialty Exam: Physical Exam  Nursing note and vitals reviewed. Constitutional: He appears well-developed and well-nourished.  HENT:  Head: Normocephalic and atraumatic.  Eyes: Pupils are equal, round, and reactive to light. Conjunctivae are  normal.  Neck: Normal range of motion.  Cardiovascular: Regular rhythm and normal heart sounds.  Respiratory: Effort normal. No respiratory distress.  GI: Soft.  Musculoskeletal: Normal range of motion.  Neurological: He is alert.  Skin: Skin is warm and dry.     Psychiatric: He has a normal mood and affect. His speech is normal and behavior is normal. Judgment and thought content normal. Cognition and memory are normal.    Review of Systems  Constitutional: Negative.   HENT: Negative.   Eyes: Negative.   Respiratory: Negative.   Cardiovascular: Negative.   Gastrointestinal: Negative.   Musculoskeletal: Negative.   Skin: Negative.   Neurological: Negative.   Psychiatric/Behavioral: Positive for substance abuse. Negative for depression, hallucinations, memory loss and suicidal ideas. The patient is nervous/anxious. The patient does not have insomnia.     Blood pressure 137/86, pulse 88, temperature 98.2 F (36.8 C), temperature source Oral, resp. rate 20, height 6' 2"  (1.88 m), weight 95.3 kg, SpO2 100 %.Body mass index is 26.96 kg/m.  General Appearance: Casual  Eye Contact:  Good  Speech:  Normal Rate  Volume:  Normal  Mood:  Dysphoric  Affect:  Constricted  Thought Process:  Goal Directed  Orientation:  Full (Time, Place, and Person)  Thought Content:  Logical  Suicidal Thoughts:  No  Homicidal Thoughts:  No  Memory:  Immediate;   Fair Recent;   Fair Remote;   Fair  Judgement:  Fair  Insight:  Fair  Psychomotor Activity:  Normal  Concentration:  Concentration: Fair  Recall:  [employer] of Knowledge:  Fair  Language:  Fair  Akathisia:  No  Handed:  Right  AIMS (if indicated):     Assets:  Communication Skills Desire for Improvement Housing Physical Health Resilience  ADL's:  Intact  Cognition:  WNL  Sleep:        Treatment Plan Summary: Daily contact with patient to assess and evaluate symptoms and progress in treatment, Medication management and Plan  Patient currently does not meet commitment criteria.  He is lucid with good insight and agrees to outpatient treatment.  He says he is anxious this morning because he missed his morning medicine.  I have put in an order to have him get his Depakote Zoloft and hydroxyzine this morning.  Patient does not need new prescriptions.  He agrees to follow-up at [facility].  Discontinue the IV C.  Case reviewed with emergency room doctor and TTS.  Patient agrees to plan.  Disposition: No evidence of imminent risk to self or others at present.   Patient does not meet criteria for psychiatric inpatient admission. Supportive therapy provided about ongoing stressors. Discussed crisis plan, support from social network, calling 911, coming to the Emergency Department, and calling Suicide Hotline.  Alethia Berthold, MD 11/11/2017 1:25 PM

## 2017-11-11 NOTE — Discharge Instructions (Addendum)
Take your regular medications as prescribed.  Follow-up at [facility].  Return to the ER for any thoughts of wanting to hurt yourself or anyone else, hearing voices, severe anxiety, or any other new or worsening symptoms that concern you.
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
## 2017-11-11 NOTE — ED Notes (Signed)
Patient alert and oriented. Patient observed in room sitting on bed. Patient did not engage in conversation with this Clinical research associate. Patient with Q 15 minute checks in progress and remains safe on unit. Monitoring continues.

## 2017-11-11 NOTE — ED Notes (Signed)
Pt Re- IVC'd by Dr. Brown  

## 2017-11-11 NOTE — ED Notes (Signed)
Patient is speaking with S.O.C. MD. 
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
## 2018-05-26 IMAGING — DX DG CHEST 1V PORT
1 series · 1 of 1 positions shown · non-contrast
Comparison: None.

CLINICAL DATA: Cough, hemoptysis and chest pain.

EXAM:
PORTABLE CHEST 1 VIEW

[chest ap]
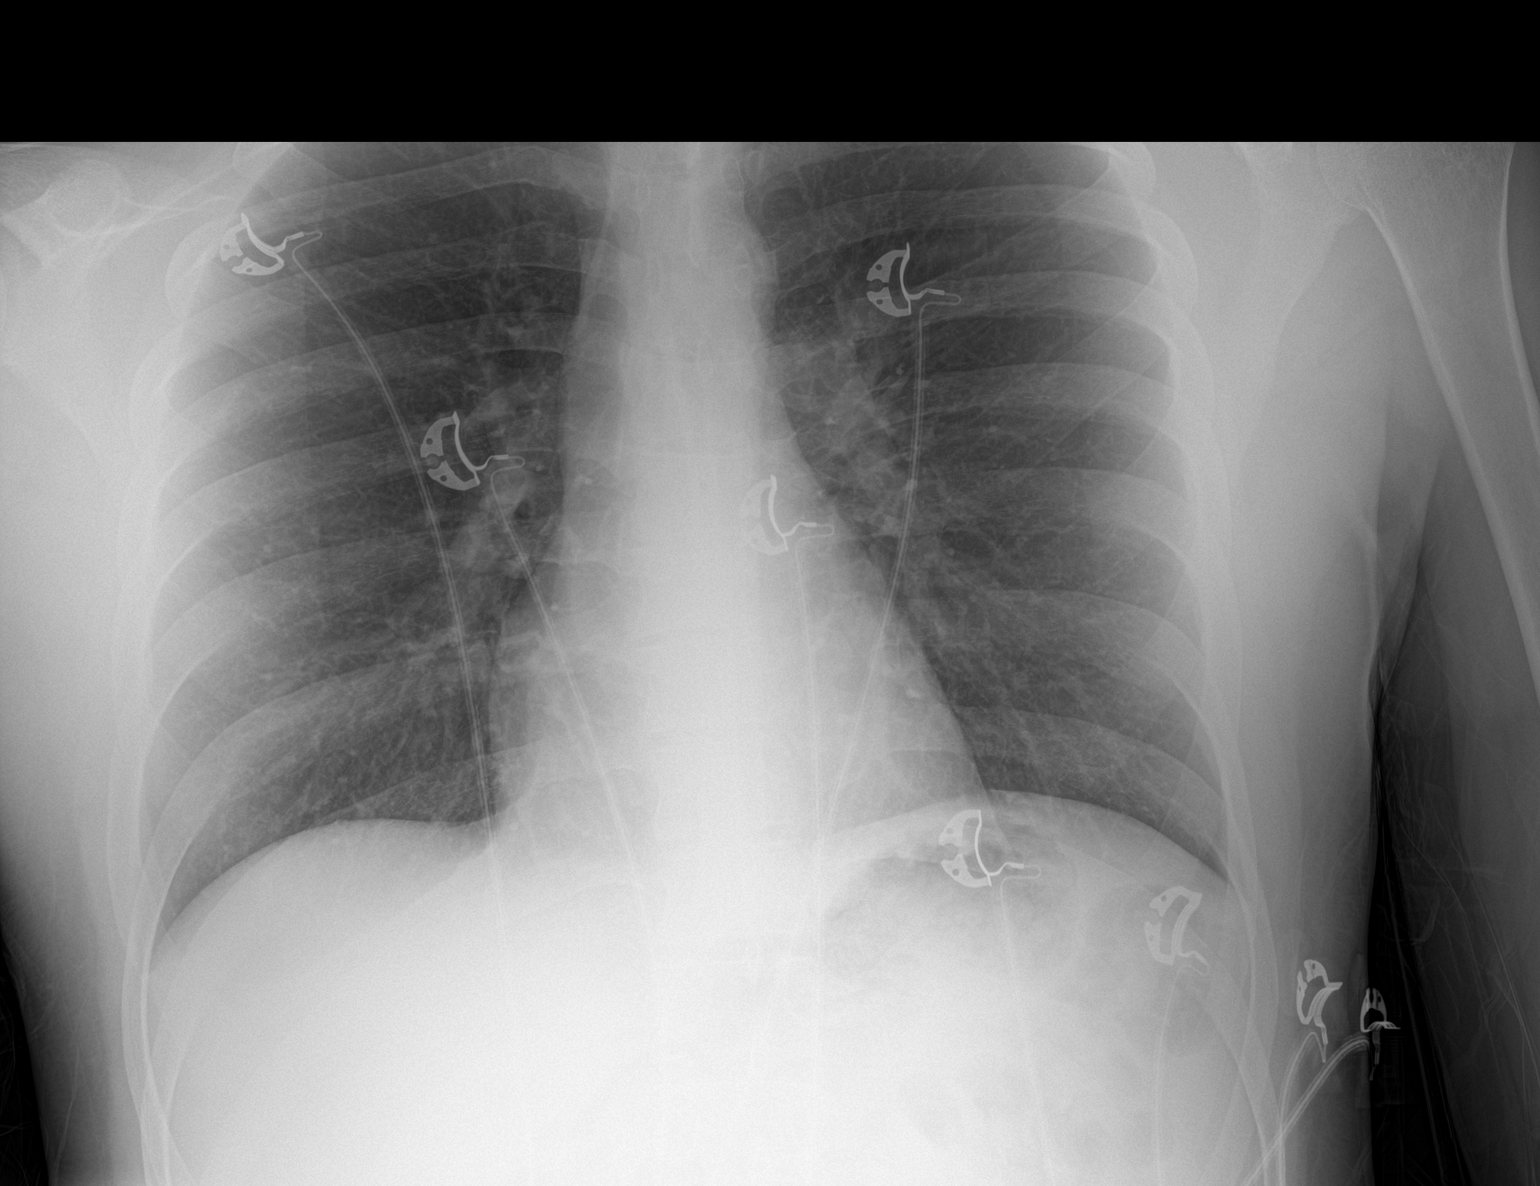

[1 of 1 positions shown; findings below may reference images not displayed]

FINDINGS: The heart size and mediastinal contours are within normal limits.
There is no evidence of pulmonary edema, consolidation,
pneumothorax, nodule or pleural fluid. The visualized skeletal
structures are unremarkable.
IMPRESSION: No active disease.

## 2021-02-16 ENCOUNTER — Emergency Department
Admit: 2021-02-16 | Payer: BLUE CROSS/BLUE SHIELD | Primary: Student in an Organized Health Care Education/Training Program

## 2021-02-16 ENCOUNTER — Inpatient Hospital Stay
Admit: 2021-02-16 | Discharge: 2021-02-16 | Disposition: A | Discharge status: Home / Self Care | Payer: BLUE CROSS/BLUE SHIELD | Attending: Emergency Medicine

## 2021-02-16 DIAGNOSIS — J069 Acute upper respiratory infection, unspecified: Secondary | ICD-10-CM

## 2021-02-16 LAB — SARS-COV-2_FLU_RSV
COVID-19: NEGATIVE
Influenza A PCR: NEGATIVE
Influenza B PCR: NEGATIVE
RSV by PCR: NEGATIVE

## 2021-02-16 MED ORDER — IPRATROPIUM-ALBUTEROL 2.5 MG-0.5 MG/3 ML NEB SOLUTION
2.5 mg-0.5 mg/3 ml | RESPIRATORY_TRACT | Status: AC
Start: 2021-02-16 — End: 2021-02-16
  Administered 2021-02-16: 15:00:00 via RESPIRATORY_TRACT

## 2021-02-16 MED FILL — IPRATROPIUM-ALBUTEROL 2.5 MG-0.5 MG/3 ML NEB SOLUTION: 2.5 mg-0.5 mg/3 ml | RESPIRATORY_TRACT | Qty: 3

## 2021-02-16 NOTE — ED Provider Notes (Signed)
[facility]  Emergency Department Treatment Report    Patient: Jonathan Hughes Age: 25 y.o. Sex: male    Date of Birth: [birth year] Admit Date: 02/16/2021 PCP: Unknown, Provider, MD   MRN: [id number]  CSN: [id number]     Room: ER47/ER47 Time Dictated: 1:06 PM        Chief Complaint   Cough, shortness of breath   History of Present Illness   25 y.o. male presents to the ED with complaints of cough and shortness of breath ongoing for the past two days.  Patient reports subjective fevers and chills.  Just prior to arrival, patient reported that he was having more difficulty breathing, so he presented to the ED.  He reports nasal congestion and sore throat.  He denies chest pain, nausea, vomiting, leg pain, leg swelling, recent period of immobility, recent surgery, or hormonal therapy.  He denies having any medical history.    Review of Systems   Review of Systems   Constitutional:  Negative for chills and fever.   Respiratory:  Negative for cough and shortness of breath.    Cardiovascular:  Negative for chest pain and leg swelling.   Gastrointestinal:  Negative for abdominal pain, diarrhea, nausea and vomiting.   Genitourinary:  Negative for dysuria and frequency.   Skin:  Negative for rash.   Neurological:  Negative for dizziness and headaches.   Psychiatric/Behavioral:  Negative for substance abuse and suicidal ideas.    All other systems reviewed and are negative.    Past Medical/Surgical History   History reviewed. No pertinent past medical history.  History reviewed. No pertinent surgical history.    Social History     Social History     Socioeconomic History    Marital status: SINGLE       Family History   History reviewed. No pertinent family history.    Current Medications       Allergies   No Known Allergies    Physical Exam     ED Triage Vitals [02/16/21 0910]   ED Encounter Vitals Group      BP (!) 145/85      Pulse (Heart Rate) 85      Resp Rate 19      Temp 98.6 ??F (37 ??C)      Temp src        O2 Sat (%) 96 %      Weight 210 lb      Height 6\' 1"      Physical Exam  Vitals and nursing note reviewed.   Constitutional:       General: He is not in acute distress.     Appearance: Normal appearance. He is not toxic-appearing.   HENT:      Head: Normocephalic and atraumatic.      Nose: Nose normal. No congestion.      Mouth/Throat:      Mouth: Mucous membranes are moist.      Pharynx: Oropharynx is clear.   Eyes:      General: No scleral icterus.     Extraocular Movements: Extraocular movements intact.   Cardiovascular:      Rate and Rhythm: Normal rate and regular rhythm.   Pulmonary:      Effort: Pulmonary effort is normal. No respiratory distress.      Breath sounds: No wheezing, rhonchi or rales.   Abdominal:      General: Abdomen is flat. There is no distension.      Palpations: Abdomen  is soft.      Tenderness: There is no abdominal tenderness. There is no guarding or rebound.   Musculoskeletal:         General: No swelling or tenderness. Normal range of motion.      Cervical back: Normal range of motion and neck supple.   Skin:     General: Skin is warm and dry.      Capillary Refill: Capillary refill takes less than 2 seconds.   Neurological:      General: No focal deficit present.      Mental Status: He is alert. Mental status is at baseline.   Psychiatric:         Mood and Affect: Mood normal.         Behavior: Behavior normal.         Impression and Management Plan   25 year old presents with cough and shortness of breath.    Diagnostic Studies   Lab:   Recent Results (from the past 12 hour(s))   [id number]    Collection Time: 02/16/21 10:05 AM    Specimen: NASOPHARYNGEAL SWAB   Result Value Ref Range    COVID-19 NEGATIVE NEGATIVE      Influenza A PCR NEGATIVE NEGATIVE      Influenza B PCR NEGATIVE NEGATIVE      RSV by PCR NEGATIVE NEGATIVE         Imaging:    XR CHEST PA LAT    Result Date: 02/16/2021  INDICATION: cough sob.  Dyspnea TECHNIQUE: PA/lateral Chest Radiograph. COMPARISON:  none     IMPRESSION: Heart size is normal. Lungs are clear and well aerated. No focal consolidation. Electronically signed by: Delice Bison, MD 02/16/2021 10:34 AM EST      My Interpretation of imaging shows NAD on chest x-ray      Medical Decision Making/ED Course   Given fever, nasal congestion, and sore throat, as suspect viral upper respiratory infection.  Chest x-ray was negative for pneumonia.  He is well-appearing with acceptable vitals.  Pulse ox 96-100% on room air.  Do not feel that any further emergent work-up is necessary.  Plan to discharge with primary care follow-up.    Final Diagnosis       ICD-10-CM ICD-9-CM   1. Viral upper respiratory infection  J06.9 465.9     Disposition   Discharge     Judithann Sauger, DO  February 16, 2021      My signature above authenticates this document and my orders, the final    diagnosis (es), discharge prescription (s), and instructions in the Epic    record.  If you have any questions please contact [phone].     Nursing notes have been reviewed by the physician/ advanced practice    Clinician.

## 2021-02-16 NOTE — ED Notes (Signed)
Patient stated understanding of discharge instructions. Patient was ambulatory upon discharge. Patient received no prescriptions.

## 2021-02-16 NOTE — ED Notes (Signed)
Pt comes by ems for cough and sob.
